# Patient Record
Sex: Female | Born: 1950
Health system: Southern US, Community
[De-identification: ages and names within clinical notes are randomized; demographics above are authoritative.]

## PROBLEM LIST (undated history)

## (undated) DIAGNOSIS — E11319 Type 2 diabetes mellitus with unspecified diabetic retinopathy without macular edema: Secondary | ICD-10-CM

## (undated) DIAGNOSIS — E119 Type 2 diabetes mellitus without complications: Secondary | ICD-10-CM

## (undated) DIAGNOSIS — E113499 Type 2 diabetes mellitus with severe nonproliferative diabetic retinopathy without macular edema, unspecified eye: Secondary | ICD-10-CM

## (undated) DIAGNOSIS — Z780 Asymptomatic menopausal state: Secondary | ICD-10-CM

## (undated) DIAGNOSIS — N189 Chronic kidney disease, unspecified: Secondary | ICD-10-CM

## (undated) DIAGNOSIS — M8580 Other specified disorders of bone density and structure, unspecified site: Secondary | ICD-10-CM

## (undated) DIAGNOSIS — L309 Dermatitis, unspecified: Secondary | ICD-10-CM

## (undated) DIAGNOSIS — F32A Depression, unspecified: Secondary | ICD-10-CM

## (undated) DIAGNOSIS — L409 Psoriasis, unspecified: Secondary | ICD-10-CM

## (undated) DIAGNOSIS — H548 Legal blindness, as defined in USA: Secondary | ICD-10-CM

## (undated) DIAGNOSIS — E1121 Type 2 diabetes mellitus with diabetic nephropathy: Secondary | ICD-10-CM

## (undated) DIAGNOSIS — C44310 Basal cell carcinoma of skin of unspecified parts of face: Secondary | ICD-10-CM

## (undated) DIAGNOSIS — F329 Major depressive disorder, single episode, unspecified: Secondary | ICD-10-CM

## (undated) HISTORY — PX: TUBAL LIGATION: SHX77

## (undated) HISTORY — DX: Legal blindness, as defined in USA: H54.8

## (undated) HISTORY — DX: Type 2 diabetes mellitus with severe nonproliferative diabetic retinopathy without macular edema, unspecified eye: E11.3499

## (undated) HISTORY — DX: Type 2 diabetes mellitus with diabetic nephropathy: E11.21

## (undated) HISTORY — DX: Major depressive disorder, single episode, unspecified: F32.9

## (undated) HISTORY — DX: Psoriasis, unspecified: L40.9

## (undated) HISTORY — DX: Chronic kidney disease, unspecified: N18.9

## (undated) HISTORY — PX: CATARACT EXTRACTION: SUR2

## (undated) HISTORY — DX: Type 2 diabetes mellitus with unspecified diabetic retinopathy without macular edema: E11.319

## (undated) HISTORY — DX: Dermatitis, unspecified: L30.9

## (undated) HISTORY — DX: Depression, unspecified: F32.A

## (undated) HISTORY — DX: Type 2 diabetes mellitus without complications: E11.9

## (undated) HISTORY — DX: Other specified disorders of bone density and structure, unspecified site: M85.80

## (undated) HISTORY — DX: Basal cell carcinoma of skin of unspecified parts of face: C44.310

## (undated) HISTORY — DX: Asymptomatic menopausal state: Z78.0

## (undated) HISTORY — PX: EYE SURGERY: SHX253

---

## 2012-11-07 LAB — HM PAP SMEAR

## 2013-08-20 HISTORY — PX: COLONOSCOPY: SHX174

## 2014-08-09 LAB — HM COLONOSCOPY

## 2017-02-27 ENCOUNTER — Ambulatory Visit (INDEPENDENT_AMBULATORY_CARE_PROVIDER_SITE_OTHER): Payer: Medicare Other | Admitting: Physician Assistant

## 2017-02-27 ENCOUNTER — Encounter: Payer: Self-pay | Admitting: Physician Assistant

## 2017-02-27 VITALS — BP 100/61 | HR 93 | Ht 61.5 in | Wt 154.0 lb

## 2017-02-27 DIAGNOSIS — Z79899 Other long term (current) drug therapy: Secondary | ICD-10-CM

## 2017-02-27 DIAGNOSIS — Z13820 Encounter for screening for osteoporosis: Secondary | ICD-10-CM | POA: Diagnosis not present

## 2017-02-27 DIAGNOSIS — L309 Dermatitis, unspecified: Secondary | ICD-10-CM

## 2017-02-27 DIAGNOSIS — L409 Psoriasis, unspecified: Secondary | ICD-10-CM

## 2017-02-27 DIAGNOSIS — E113493 Type 2 diabetes mellitus with severe nonproliferative diabetic retinopathy without macular edema, bilateral: Secondary | ICD-10-CM | POA: Diagnosis not present

## 2017-02-27 DIAGNOSIS — Z780 Asymptomatic menopausal state: Secondary | ICD-10-CM

## 2017-02-27 DIAGNOSIS — F331 Major depressive disorder, recurrent, moderate: Secondary | ICD-10-CM

## 2017-02-27 DIAGNOSIS — H548 Legal blindness, as defined in USA: Secondary | ICD-10-CM

## 2017-02-27 DIAGNOSIS — L03011 Cellulitis of right finger: Secondary | ICD-10-CM

## 2017-02-27 DIAGNOSIS — Z5181 Encounter for therapeutic drug level monitoring: Secondary | ICD-10-CM

## 2017-02-27 DIAGNOSIS — F988 Other specified behavioral and emotional disorders with onset usually occurring in childhood and adolescence: Secondary | ICD-10-CM

## 2017-02-27 DIAGNOSIS — Z7689 Persons encountering health services in other specified circumstances: Secondary | ICD-10-CM

## 2017-02-27 DIAGNOSIS — Z1231 Encounter for screening mammogram for malignant neoplasm of breast: Secondary | ICD-10-CM

## 2017-02-27 DIAGNOSIS — E559 Vitamin D deficiency, unspecified: Secondary | ICD-10-CM | POA: Diagnosis not present

## 2017-02-27 DIAGNOSIS — M7751 Other enthesopathy of right foot: Secondary | ICD-10-CM | POA: Diagnosis not present

## 2017-02-27 HISTORY — DX: Legal blindness, as defined in USA: H54.8

## 2017-02-27 LAB — POCT GLYCOSYLATED HEMOGLOBIN (HGB A1C): HEMOGLOBIN A1C: 6.9

## 2017-02-27 MED ORDER — SERTRALINE HCL 50 MG PO TABS: 50.0000 mg | ORAL_TABLET | Freq: Every day | ORAL | 1 refills | Status: DC

## 2017-02-27 MED ORDER — CALCIUM CARBONATE-VITAMIN D 600-400 MG-UNIT PO TABS: 1.0000 | ORAL_TABLET | Freq: Two times a day (BID) | ORAL | 11 refills | Status: AC

## 2017-02-27 MED ORDER — PIMECROLIMUS 1 % EX CREA: TOPICAL_CREAM | Freq: Two times a day (BID) | CUTANEOUS | 2 refills | Status: AC | PRN

## 2017-02-27 MED ORDER — DAPAGLIFLOZIN PROPANEDIOL 10 MG PO TABS: 10.0000 mg | ORAL_TABLET | Freq: Every day | ORAL | 0 refills | Status: DC

## 2017-02-27 MED ORDER — AMBULATORY NON FORMULARY MEDICATION: 0 refills | Status: DC

## 2017-02-27 MED ORDER — GLIMEPIRIDE 4 MG PO TABS: 4.0000 mg | ORAL_TABLET | Freq: Every day | ORAL | 1 refills | Status: DC

## 2017-02-27 MED ORDER — ATORVASTATIN CALCIUM 10 MG PO TABS: 5.0000 mg | ORAL_TABLET | Freq: Every day | ORAL | 1 refills | Status: DC

## 2017-02-27 MED ORDER — CLOBETASOL PROPIONATE 0.05 % EX CREA: 1.0000 "application " | TOPICAL_CREAM | Freq: Two times a day (BID) | CUTANEOUS | 2 refills | Status: DC | PRN

## 2017-02-27 NOTE — Progress Notes (Signed)
HPI:                                                                Nichole Fitzgerald is a 66 y.o. female who presents to [facility] today to establish care  Patient recently relocated from [place]  DMII: taking Metformin 1000 bid, Faxiga 10mg , and Glimeperide daily. Compliant with medications. Last A1C 6.9 (1 year ago, patient reported). Checks blood sugars at home. Blood sugar range 120's (6 months ago). Reports she is legally blind from retinopathy. Denies polyuria and paresthesias. Denies hypoglycemic events. Denies ulcers/wounds on feet. Eye exam: May 2018, Dr. Mickie Hillier [facility] [place]), severe retinopathy  Foot exam: due Diet: "poor" Exercise: not currently  Depression: has been out Zoloft for a few months. Endorses depressed mood. Reports increased stress related to moving. She also is depressed about her blindness and adjusting to retirement.  Endorses poor sleep. No history of self-harm. No history of hospitalizations. Denies symptoms of mania/hypomania. Endorses one episode of suicidal thinking. Denies auditory/visual hallucinations.  Ankle pain: patient reports pain and swelling of her right heel/posterior ankle for several months. Denies known injury or trauma. She has noted pain is relieved a bit when she wears heels. Pain is exacerbated by stair climbing.   Health Maintenance Health Maintenance  Topic Date Due  . HEMOGLOBIN A1C  Aug 24, 1950  . Hepatitis C Screening  May [birth year], 1951  . FOOT EXAM  02/01/1962  . OPHTHALMOLOGY EXAM  02/01/1962  . URINE MICROALBUMIN  02/01/1962  . HIV Screening  02/02/1967  . TETANUS/TDAP  02/02/1971  . PAP SMEAR  02/01/1973  . MAMMOGRAM  02/01/2002  . COLONOSCOPY  02/01/2002  . DEXA SCAN  02/01/2017  . PNA vac Low Risk Adult (1 of 2 - PCV13) 02/01/2017  . INFLUENZA VACCINE  03/20/2017    Past Medical History:  Diagnosis Date  . Basal cell carcinoma (BCC) of face   . Depression   .  Diabetes mellitus without complication ([place])   . Diabetic retinopathy [facility])    Past Surgical History:  Procedure Laterality Date  . CATARACT EXTRACTION    . COLONOSCOPY  2015  . EYE SURGERY    . TUBAL LIGATION     Social History  Substance Use Topics  . Smoking status: Never Smoker  . Smokeless tobacco: Never Used  . Alcohol use Yes     Comment: <1 drink/month   family history includes Alcohol abuse in her father; Breast cancer in her mother; Cancer in her mother; Diabetes in her father; Heart attack in her paternal grandfather; Hypertension in her mother; Skin cancer in her mother; Stroke in her father.  ROS: Review of Systems  Constitutional: Negative.   HENT: Negative.   Eyes: Positive for blurred vision (legally blind). Negative for pain.  Respiratory: Negative.   Cardiovascular: Negative.   Gastrointestinal: Negative.   Genitourinary: Negative.   Musculoskeletal: Positive for joint pain (right heel). Negative for falls.  Skin: Negative.   Neurological: Negative.   Psychiatric/Behavioral: Positive for depression. Negative for hallucinations, memory loss, substance abuse and suicidal ideas.     Medications: Current Outpatient Prescriptions  Medication Sig Dispense Refill  . Multiple Vitamins-Minerals (MULTIVITAMIN ADULT) CHEW Chew by mouth.    . AMBULATORY NON FORMULARY MEDICATION Single  glucometer with lancets, test strips.  Check morning fasting blood sugar daily 1 each 0  . atorvastatin (LIPITOR) 10 MG tablet Take 0.5 tablets (5 mg total) by mouth daily. 45 tablet 1  . Calcium Carbonate-Vitamin D 600-400 MG-UNIT tablet Take 1 tablet by mouth 2 (two) times daily. 60 tablet 11  . clobetasol cream (TEMOVATE) 4.31 % Apply 1 application topically 2 (two) times daily as needed. 60 g 2  . dapagliflozin propanediol (FARXIGA) 10 MG TABS tablet Take 10 mg by mouth daily. 90 tablet 0  . glimepiride (AMARYL) 4 MG tablet Take 1 tablet (4 mg total) by mouth daily with breakfast.  90 tablet 1  . pimecrolimus (ELIDEL) 1 % cream Apply topically 2 (two) times daily as needed. 30 g 2  . sertraline (ZOLOFT) 50 MG tablet Take 1 tablet (50 mg total) by mouth daily. 90 tablet 1   No current facility-administered medications for this visit.    Allergies  Allergen Reactions  . Bydureon [Exenatide] Swelling    Lip swelling       Objective:  BP 100/61   Pulse 93   Ht 5' 1.5" (1.562 m)   Wt 154 lb (69.9 kg)   BMI 28.63 kg/m  Gen: well-groomed, cooperative, not ill-appearing, no distress HEENT: normal conjunctiva, TM's clear, oropharynx clear, moist mucus membranes, no thyromegaly or tenderness Pulm: Normal work of breathing, normal phonation, clear to auscultation bilaterally CV: Normal rate, regular rhythm, s1 and s2 distinct, no murmurs, clicks or rubs, no carotid bruit GI: abdomen soft, nondistended, nontender, no masses Neuro: alert and oriented x 3, EOM's intact, PERRLA, normal tone, no tremor MSK: Right posterior ankle with swelling and tenderness, full active ROM, DP and PT pulses intact; moving all extremities, normal gait and station, no peripheral edema Skin: warm, dry, intact; scaly patch on anterior neck and face Psych: good eye contact,  "depressed" mood, affect mood-congruent, normal speech and thought content  Depression screen [facility] 2/9 02/27/2017  Decreased Interest 2  Down, Depressed, Hopeless 2  PHQ - 2 Score 4  Altered sleeping 3  Tired, decreased energy 3  Change in appetite 2  Feeling bad or failure about yourself  1  Trouble concentrating 1  Moving slowly or fidgety/restless 0  Suicidal thoughts 1  PHQ-9 Score 15     Results for orders placed or performed in visit on 02/27/17 (from the past 72 hour(s))  POCT HgB A1C     Status: Abnormal   Collection Time: 02/27/17  2:12 PM  Result Value Ref Range   Hemoglobin A1C 6.9    No results found.    Assessment and Plan: 66 y.o. female with    1. Encounter to establish care - reviewed  PMH - reviewed health maintenance - Colonoscopy UTD per patient, [facility] [place] - requesting outside records - due for Mammogram and Dexa  2. Type 2 diabetes mellitus with severe nonproliferative retinopathy of both eyes, without long-term current use of insulin, macular edema presence unspecified (HCC) - POCT HgB A1C 6.9 - AMBULATORY NON FORMULARY MEDICATION; Single glucometer with lancets, test strips.  Check morning fasting blood sugar daily  Dispense: 1 each; Refill: 0 - atorvastatin (LIPITOR) 10 MG tablet; Take 0.5 tablets (5 mg total) by mouth daily.  Dispense: 45 tablet; Refill: 1 - dapagliflozin propanediol (FARXIGA) 10 MG TABS tablet; Take 10 mg by mouth daily.  Dispense: 90 tablet; Refill: 0 - glimepiride (AMARYL) 4 MG tablet; Take 1 tablet (4 mg total) by mouth daily with  breakfast.  Dispense: 90 tablet; Refill: 1 - CBC - COMPLETE METABOLIC PANEL WITH GFR   3. Legally blind - 2/2 severe diabetic retinopathy - patient states she is established with a retina specialist in the area - she does not drive  4. Retrocalcaneal bursitis (back of heel), right - heel cups - rehab exercises daily - follow-up with Sports Medicine in 1 week  5. Scalp psoriasis - clobetasol cream (TEMOVATE) 0.05 %; Apply 1 application topically 2 (two) times daily as needed.  Dispense: 60 g; Refill: 2  6. Eczema, unspecified type - pimecrolimus (ELIDEL) 1 % cream; Apply topically 2 (two) times daily as needed.  Dispense: 30 g; Refill: 2  7. Vitamin D deficiency - VITAMIN D 25 Hydroxy (Vit-D Deficiency, Fractures)  8. Encounter for monitoring statin therapy - Lipid Panel w/reflex Direct LDL  9. Breast cancer screening by mammogram - MM DIGITAL SCREENING BILATERAL; Future  10. Screening for osteoporosis - DG Bone Density; Future  11. Postmenopausal - DG Bone Density; Future - Calcium Carbonate-Vitamin D 600-400 MG-UNIT tablet; Take 1 tablet by mouth 2 (two) times daily.  Dispense: 60 tablet;  Refill: 11  12. Moderate episode of recurrent major depressive disorder (HCC) - PHQ9 score 15, moderate - restarting Zoloft. Close follow-up in 4 weeks - sertraline (ZOLOFT) 50 MG tablet; Take 1 tablet (50 mg total) by mouth daily.  Dispense: 90 tablet; Refill: 1  Patient education and anticipatory guidance given Patient agrees with treatment plan Follow-up in 4 weeks or sooner as needed  Darlyne Russian PA-C

## 2017-02-27 NOTE — Patient Instructions (Addendum)
Schedule an appointment with Sports Medicine for heel pain - Avoid flat shoes and barefoot walking - Purchase some OTC heel cups for added support - Rehab exercises  I have ordered fasting labs. The lab is a walk-in open M-F 7:30a-4:30p (closed 12:30-1:30p). Nothing to eat or drink after midnight or at least 8 hours before your blood draw. You can have water and your medications.   For mood: - Restart Zoloft 50mg  daily - Try to get at least 3 days of cardiovascular exercise per week - Follow-up in 4 weeks If you experience any worsening suicidal thoughts, go to the nearest emergency room or call [facility] 1-800-SUICIDE [place] [phone]   Living With Depression Everyone experiences occasional disappointment, sadness, and loss in their lives. When you are feeling down, blue, or sad for at least 2 weeks in a row, it may mean that you have depression. Depression can affect your thoughts and feelings, relationships, daily activities, and physical health. It is caused by changes in the way your brain functions. If you receive a diagnosis of depression, your health care provider will tell you which type of depression you have and what treatment options are available to you. If you are living with depression, there are ways to help you recover from it and also ways to prevent it from coming back. How to cope with lifestyle changes Coping with stress Stress is your body's reaction to life changes and events, both good and bad. Stressful situations may include:  Getting married.  The death of a spouse.  Losing a job.  Retiring.  Having a baby.  Stress can last just a few hours or it can be ongoing. Stress can play a major role in depression, so it is important to learn both how to cope with stress and how to think about it differently. Talk with your health care provider or a counselor if you would like to learn more about stress reduction. He or she may  suggest some stress reduction techniques, such as:  Music therapy. This can include creating music or listening to music. Choose music that you enjoy and that inspires you.  Mindfulness-based meditation. This kind of meditation can be done while sitting or walking. It involves being aware of your normal breaths, rather than trying to control your breathing.  Centering prayer. This is a kind of meditation that involves focusing on a spiritual word or phrase. Choose a word, phrase, or sacred image that is meaningful to you and that brings you peace.  Deep breathing. To do this, expand your stomach and inhale slowly through your nose. Hold your breath for 3-5 seconds, then exhale slowly, allowing your stomach muscles to relax.  Muscle relaxation. This involves intentionally tensing muscles then relaxing them.  Choose a stress reduction technique that fits your lifestyle and personality. Stress reduction techniques take time and practice to develop. Set aside 5-15 minutes a day to do them. Therapists can offer training in these techniques. The training may be covered by some insurance plans. Other things you can do to manage stress include:  Keeping a stress diary. This can help you learn what triggers your stress and ways to control your response.  Understanding what your limits are and saying no to requests or events that lead to a schedule that is too full.  Thinking about how you respond to certain situations. You may not be able to control everything, but you can control how you react.  Adding humor to  your life by watching funny films or TV shows.  Making time for activities that help you relax and not feeling guilty about spending your time this way.  Medicines Your health care provider may suggest certain medicines if he or she feels that they will help improve your condition. Avoid using alcohol and other substances that may prevent your medicines from working properly (may interact).  It is also important to:  Talk with your pharmacist or health care provider about all the medicines that you take, their possible side effects, and what medicines are safe to take together.  Make it your goal to take part in all treatment decisions (shared decision-making). This includes giving input on the side effects of medicines. It is best if shared decision-making with your health care provider is part of your total treatment plan.  If your health care provider prescribes a medicine, you may not notice the full benefits of it for 4-8 weeks. Most people who are treated for depression need to be on medicine for at least 6-12 months after they feel better. If you are taking medicines as part of your treatment, do not stop taking medicines without first talking to your health care provider. You may need to have the medicine slowly decreased (tapered) over time to decrease the risk of harmful side effects. Relationships Your health care provider may suggest family therapy along with individual therapy and drug therapy. While there may not be family problems that are causing you to feel depressed, it is still important to make sure your family learns as much as they can about your mental health. Having your family's support can help make your treatment successful. How to recognize changes in your condition Everyone has a different response to treatment for depression. Recovery from major depression happens when you have not had signs of major depression for two months. This may mean that you will start to:  Have more interest in doing activities.  Feel less hopeless than you did 2 months ago.  Have more energy.  Overeat less often, or have better or improving appetite.  Have better concentration.  Your health care provider will work with you to decide the next steps in your recovery. It is also important to recognize when your condition is getting worse. Watch for these signs:  Having fatigue  or low energy.  Eating too much or too little.  Sleeping too much or too little.  Feeling restless, agitated, or hopeless.  Having trouble concentrating or making decisions.  Having unexplained physical complaints.  Feeling irritable, angry, or aggressive.  Get help as soon as you or your family members notice these symptoms coming back. How to get support and help from others How to talk with friends and family members about your condition Talking to friends and family members about your condition can provide you with one way to get support and guidance. Reach out to trusted friends or family members, explain your symptoms to them, and let them know that you are working with a health care provider to treat your depression. Financial resources Not all insurance plans cover mental health care, so it is important to check with your insurance carrier. If paying for co-pays or counseling services is a problem, search for a local or county mental health care center. They may be able to offer public mental health care services at low or no cost when you are not able to see a private health care provider. If you are taking medicine for depression,  you may be able to get the generic form, which may be less expensive. Some makers of prescription medicines also offer help to patients who cannot afford the medicines they need. Follow these instructions at home:  Get the right amount and quality of sleep.  Cut down on using caffeine, tobacco, alcohol, and other potentially harmful substances.  Try to exercise, such as walking or lifting small weights.  Take over-the-counter and prescription medicines only as told by your health care provider.  Eat a healthy diet that includes plenty of vegetables, fruits, whole grains, low-fat dairy products, and lean protein. Do not eat a lot of foods that are high in solid fats, added sugars, or salt.  Keep all follow-up visits as told by your health care  provider. This is important. Contact a health care provider if:  You stop taking your antidepressant medicines, and you have any of these symptoms: ? Nausea. ? Headache. ? Feeling lightheaded. ? Chills and body aches. ? Not being able to sleep (insomnia).  You or your friends and family think your depression is getting worse. Get help right away if:  You have thoughts of hurting yourself or others. If you ever feel like you may hurt yourself or others, or have thoughts about taking your own life, get help right away. You can go to your nearest emergency department or call:  Your local emergency services (911 in the [place]  A suicide crisis helpline, such as the [facility] at 913-148-3676. This is open 24-hours a day.  Summary  If you are living with depression, there are ways to help you recover from it and also ways to prevent it from coming back.  Work with your health care team to create a management plan that includes counseling, stress management techniques, and healthy lifestyle habits. This information is not intended to replace advice given to you by your health care provider. Make sure you discuss any questions you have with your health care provider. Document Released: 07/09/2016 Document Revised: 07/09/2016 Document Reviewed: 07/09/2016 Elsevier Interactive Patient Education  [employer].

## 2017-02-28 DIAGNOSIS — Z780 Asymptomatic menopausal state: Secondary | ICD-10-CM | POA: Insufficient documentation

## 2017-02-28 DIAGNOSIS — L409 Psoriasis, unspecified: Secondary | ICD-10-CM | POA: Insufficient documentation

## 2017-02-28 DIAGNOSIS — F988 Other specified behavioral and emotional disorders with onset usually occurring in childhood and adolescence: Secondary | ICD-10-CM | POA: Insufficient documentation

## 2017-02-28 DIAGNOSIS — L309 Dermatitis, unspecified: Secondary | ICD-10-CM | POA: Insufficient documentation

## 2017-02-28 DIAGNOSIS — M7751 Other enthesopathy of right foot: Secondary | ICD-10-CM | POA: Insufficient documentation

## 2017-02-28 DIAGNOSIS — F331 Major depressive disorder, recurrent, moderate: Secondary | ICD-10-CM | POA: Insufficient documentation

## 2017-02-28 HISTORY — DX: Psoriasis, unspecified: L40.9

## 2017-02-28 HISTORY — DX: Dermatitis, unspecified: L30.9

## 2017-02-28 HISTORY — DX: Asymptomatic menopausal state: Z78.0

## 2017-02-28 MED ORDER — MUPIROCIN 2 % EX OINT: 1.0000 "application " | TOPICAL_OINTMENT | Freq: Three times a day (TID) | CUTANEOUS | 0 refills | Status: AC

## 2017-02-28 MED ORDER — CHLORHEXIDINE GLUCONATE 4 % EX LIQD: CUTANEOUS | 0 refills | Status: DC

## 2017-03-04 ENCOUNTER — Ambulatory Visit (INDEPENDENT_AMBULATORY_CARE_PROVIDER_SITE_OTHER): Payer: Medicare Other | Admitting: Family Medicine

## 2017-03-04 ENCOUNTER — Encounter: Payer: Self-pay | Admitting: Family Medicine

## 2017-03-04 VITALS — BP 102/64 | HR 102 | Ht 61.5 in | Wt 150.0 lb

## 2017-03-04 DIAGNOSIS — M9261 Juvenile osteochondrosis of tarsus, right ankle: Secondary | ICD-10-CM | POA: Diagnosis not present

## 2017-03-04 DIAGNOSIS — M7661 Achilles tendinitis, right leg: Secondary | ICD-10-CM | POA: Diagnosis not present

## 2017-03-04 MED ORDER — NITROGLYCERIN 0.2 MG/HR TD PT24: MEDICATED_PATCH | TRANSDERMAL | 1 refills | Status: AC

## 2017-03-04 MED ORDER — METFORMIN HCL 1000 MG PO TABS: 1000.0000 mg | ORAL_TABLET | Freq: Two times a day (BID) | ORAL | 1 refills | Status: DC

## 2017-03-04 NOTE — Progress Notes (Signed)
   Subjective:    I'm seeing this patient as a consultation for:  Nichole Dredge, PA-C   CC: Right posterior heel pain.  HPI: Patient notes a several month history of pain at the right posterior heel. This is associated with swelling. She denies any specific injury. She notes the pain is worse when she climbs stairs or goes up a hill and with pressure at the posterior heel. She has tried over-the-counter medicines for pain which have helped a little. Additionally she has tried ice and relative rest which has helped some. She denies any radiating pain weakness or numbness fevers or chills.  Past medical history, Surgical history, Family history not pertinant except as noted below, Social history, Allergies, and medications have been entered into the medical record, reviewed, and no changes needed.   Review of Systems: No new headache, visual changes, nausea, vomiting, diarrhea, constipation, dizziness, abdominal pain, skin rash, fevers, chills, night sweats, weight loss, swollen lymph nodes, body aches, joint swelling, muscle aches, chest pain, shortness of breath, mood changes, visual or auditory hallucinations.   Objective:    Vitals:   03/04/17 1428  BP: 102/64  Pulse: (!) 102   General: Well Developed, well nourished, and in no acute distress.  Neuro/Psych: Alert and oriented x3, extra-ocular muscles intact, able to move all 4 extremities, sensation grossly intact. Skin: Warm and dry, no rashes noted.  Respiratory: Not using accessory muscles, speaking in full sentences, trachea midline.  Cardiovascular: Pulses palpable, no extremity edema. Abdomen: Does not appear distended. MSK: Right posterior ankle shows swelling at the insertion of the Achilles tendon onto the calcaneus. This is mildly tender to touch. She has intact motion of the foot and ankle with normal pulses and capillary refill.  Limited musculoskeletal ultrasound of the right Achilles tendon shows an intact  normal size Achilles tendon with no significant retrocalcaneal bursitis. As the Achilles tendon attaches onto the calcaneus there is a hyperechoic change within the tendon consistent with Haglund's deformity. There is increased Doppler activity as well indicative of tendinitis.  No results found for this or any previous visit (from the past 24 hour(s)). No results found.  Impression and Recommendations:    Assessment and Plan: 66 y.o. female with Achilles tendinitis and Haglund's deformity. Plan to work on eccentric heel exercises and nitroglycerin patch protocol. We'll recheck in 6 weeks or sooner if needed..   No orders of the defined types were placed in this encounter.  Meds ordered this encounter  Medications  . DISCONTD: metFORMIN (GLUCOPHAGE) 1000 MG tablet    Sig: Take 1,000 mg by mouth 2 (two) times daily with a meal.  . clobetasol (TEMOVATE) 0.05 % external solution    Sig: Apply 1 application topically 2 (two) times daily as needed.  . metFORMIN (GLUCOPHAGE) 1000 MG tablet    Sig: Take 1 tablet (1,000 mg total) by mouth 2 (two) times daily with a meal.    Dispense:  180 tablet    Refill:  1  . nitroGLYCERIN (NITRODUR - DOSED IN MG/24 HR) 0.2 mg/hr patch    Sig: 1/4 patch to heel for tendonitis    Dispense:  30 patch    Refill:  1    Discussed warning signs or symptoms. Please see discharge instructions. Patient expresses understanding.

## 2017-03-04 NOTE — Patient Instructions (Signed)
Thank you for coming in today. Do the slow heel exercises 30 reps 2-3x daily.  Apply the nitro patches.  Continue ICE.   Recheck in 4-6 weeks.  Return sooner if needed.   Nitroglycerin Protocol   Apply 1/4 nitroglycerin patch to affected area daily.  Change position of patch within the affected area every 24 hours.  You may experience a headache during the first 1-2 weeks of using the patch, these should subside.  If you experience headaches after beginning nitroglycerin patch treatment, you may take your preferred over the counter pain reliever.  Another side effect of the nitroglycerin patch is skin irritation or rash related to patch adhesive.  Please notify our office if you develop more severe headaches or rash, and stop the patch.  Tendon healing with nitroglycerin patch may require 12 to 24 weeks depending on the extent of injury.  Men should not use if taking Viagra, Cialis, or Levitra.   Do not use if you have migraines or rosacea.     Achilles Tendinitis Achilles tendinitis is inflammation of the tough, cord-like band that attaches the lower leg muscles to the heel bone (Achilles tendon). This is usually caused by overusing the tendon and the ankle joint. Achilles tendinitis usually gets better over time with treatment and caring for yourself at home. It can take weeks or months to heal completely. What are the causes? This condition may be caused by:  A sudden increase in exercise or activity, such as running.  Doing the same exercises or activities (such as jumping) over and over.  Not warming up calf muscles before exercising.  Exercising in shoes that are worn out or not made for exercise.  Having arthritis or a bone growth (spur) on the back of the heel bone. This can rub against the tendon and hurt it.  Age-related wear and tear. Tendons become less flexible with age and more likely to be injured.  What are the signs or symptoms? Common symptoms of  this condition include:  Pain in the Achilles tendon or in the back of the leg, just above the heel. The pain usually gets worse with exercise.  Stiffness or soreness in the back of the leg, especially in the morning.  Swelling of the skin over the Achilles tendon.  Thickening of the tendon.  Bone spurs at the bottom of the Achilles tendon, near the heel.  Trouble standing on tiptoe.  How is this diagnosed? This condition is diagnosed based on your symptoms and a physical exam. You may have tests, including:  X-rays.  MRI.  How is this treated? The goal of treatment is to relieve symptoms and help your injury heal. Treatment may include:  Decreasing or stopping activities that caused the tendinitis. This may mean switching to low-impact exercises like biking or swimming.  Icing the injured area.  Doing physical therapy, including strengthening and stretching exercises.  NSAIDs to help relieve pain and swelling.  Using supportive shoes, wraps, heel lifts, or a walking boot (air cast).  Surgery. This may be done if your symptoms do not improve after 6 months.  Using high-energy shock wave impulses to stimulate the healing process (extracorporeal shock wave therapy). This is rare.  Injection of medicines to help relieve inflammation (corticosteroids). This is rare.  Follow these instructions at home: If you have an air cast:  Wear the cast as told by your health care provider. Remove it only as told by your health care provider.  Loosen the cast if  your toes tingle, become numb, or turn cold and blue. Activity  Gradually return to your normal activities once your health care provider approves. Do not do activities that cause pain. ? Consider doing low-impact exercises, like cycling or swimming.  If you have an air cast, ask your health care provider when it is safe for you to drive.  If physical therapy was prescribed, do exercises as told by your health care  provider or physical therapist. Managing pain, stiffness, and swelling  Raise (elevate) your foot above the level of your heart while you are sitting or lying down.  Move your toes often to avoid stiffness and to lessen swelling.  If directed, put ice on the injured area: ? Put ice in a plastic bag. ? Place a towel between your skin and the bag. ? Leave the ice on for 20 minutes, 2-3 times a day General instructions  If directed, wrap your foot with an elastic bandage or other wrap. This can help keep your tendon from moving too much while it heals. Your health care provider will show you how to wrap your foot correctly.  Wear supportive shoes or heel lifts only as told by your health care provider.  Take over-the-counter and prescription medicines only as told by your health care provider.  Keep all follow-up visits as told by your health care provider. This is important. Contact a health care provider if:  You have symptoms that gets worse.  You have pain that does not get better with medicine.  You develop new, unexplained symptoms.  You develop warmth and swelling in your foot.  You have a fever. Get help right away if:  You have a sudden popping sound or sensation in your Achilles tendon followed by severe pain.  You cannot move your toes or foot.  You cannot put any weight on your foot. Summary  Achilles tendinitis is inflammation of the tough, cord-like band that attaches the lower leg muscles to the heel bone (Achilles tendon).  This condition is usually caused by overusing the tendon and the ankle joint. It can also be caused by arthritis or normal aging.  The most common symptoms of this condition include pain, swelling, or stiffness in the Achilles tendon or in the back of the leg.  This condition is usually treated with rest, NSAIDs, and physical therapy. This information is not intended to replace advice given to you by your health care provider. Make sure  you discuss any questions you have with your health care provider. Document Released: 05/16/2005 Document Revised: 06/25/2016 Document Reviewed: 06/25/2016 Elsevier Interactive Patient Education  2017 [employer].

## 2017-03-12 ENCOUNTER — Ambulatory Visit (INDEPENDENT_AMBULATORY_CARE_PROVIDER_SITE_OTHER): Payer: Medicare Other

## 2017-03-12 DIAGNOSIS — M85852 Other specified disorders of bone density and structure, left thigh: Secondary | ICD-10-CM | POA: Diagnosis not present

## 2017-03-12 DIAGNOSIS — Z780 Asymptomatic menopausal state: Secondary | ICD-10-CM | POA: Diagnosis not present

## 2017-03-12 DIAGNOSIS — Z1231 Encounter for screening mammogram for malignant neoplasm of breast: Secondary | ICD-10-CM | POA: Diagnosis not present

## 2017-03-12 DIAGNOSIS — M8588 Other specified disorders of bone density and structure, other site: Secondary | ICD-10-CM

## 2017-03-12 DIAGNOSIS — Z13820 Encounter for screening for osteoporosis: Secondary | ICD-10-CM

## 2017-03-12 DIAGNOSIS — M8589 Other specified disorders of bone density and structure, multiple sites: Secondary | ICD-10-CM | POA: Diagnosis not present

## 2017-03-14 ENCOUNTER — Encounter: Payer: Self-pay | Admitting: Physician Assistant

## 2017-03-14 DIAGNOSIS — M8580 Other specified disorders of bone density and structure, unspecified site: Secondary | ICD-10-CM

## 2017-03-14 HISTORY — DX: Other specified disorders of bone density and structure, unspecified site: M85.80

## 2017-03-14 NOTE — Progress Notes (Signed)
Bone density does show osteopenia, or decreased bone density Recommended treatment is twice daily Calcium and Vitamin D There is an over-the-counter combination pill. The correct dose is 600mg  of Calcium and 400mg  of vit D twice a day Also regular weight-bearing exercise

## 2017-03-22 ENCOUNTER — Encounter: Payer: Self-pay | Admitting: Physician Assistant

## 2017-03-26 DIAGNOSIS — E113493 Type 2 diabetes mellitus with severe nonproliferative diabetic retinopathy without macular edema, bilateral: Secondary | ICD-10-CM | POA: Diagnosis not present

## 2017-03-26 DIAGNOSIS — Z79899 Other long term (current) drug therapy: Secondary | ICD-10-CM | POA: Diagnosis not present

## 2017-03-26 DIAGNOSIS — E559 Vitamin D deficiency, unspecified: Secondary | ICD-10-CM | POA: Diagnosis not present

## 2017-03-26 DIAGNOSIS — Z5181 Encounter for therapeutic drug level monitoring: Secondary | ICD-10-CM | POA: Diagnosis not present

## 2017-03-26 LAB — CBC
HEMATOCRIT: 37.2 % (ref 35.0–45.0)
HEMOGLOBIN: 11.9 g/dL (ref 11.7–15.5)
MCH: 27.8 pg (ref 27.0–33.0)
MCHC: 32 g/dL (ref 32.0–36.0)
MCV: 86.9 fL (ref 80.0–100.0)
MPV: 10.4 fL (ref 7.5–12.5)
Platelets: 238 10*3/uL (ref 140–400)
RBC: 4.28 MIL/uL (ref 3.80–5.10)
RDW: 14.3 % (ref 11.0–15.0)
WBC: 5.9 10*3/uL (ref 3.8–10.8)

## 2017-03-27 LAB — COMPLETE METABOLIC PANEL WITH GFR
ALBUMIN: 4.1 g/dL (ref 3.6–5.1)
ALK PHOS: 45 U/L (ref 33–130)
ALT: 17 U/L (ref 6–29)
AST: 13 U/L (ref 10–35)
BILIRUBIN TOTAL: 0.3 mg/dL (ref 0.2–1.2)
BUN: 23 mg/dL (ref 7–25)
CO2: 25 mmol/L (ref 20–32)
CREATININE: 0.88 mg/dL (ref 0.50–0.99)
Calcium: 9 mg/dL (ref 8.6–10.4)
Chloride: 104 mmol/L (ref 98–110)
GFR, Est African American: 80 mL/min (ref >=60)
GFR, Est Non African American: 69 mL/min (ref >=60)
GLUCOSE: 110 mg/dL — AB (ref 65–99)
Potassium: 4.9 mmol/L (ref 3.5–5.3)
SODIUM: 138 mmol/L (ref 135–146)
TOTAL PROTEIN: 6.6 g/dL (ref 6.1–8.1)

## 2017-03-27 LAB — LIPID PANEL W/REFLEX DIRECT LDL
Cholesterol: 117 mg/dL (ref <200)
HDL: 54 mg/dL (ref >50)
LDL-CHOLESTEROL: 42 mg/dL
Non-HDL Cholesterol (Calc): 63 mg/dL (ref <130)
Total CHOL/HDL Ratio: 2.2 Ratio (ref <5.0)
Triglycerides: 128 mg/dL (ref <150)

## 2017-03-27 LAB — VITAMIN D 25 HYDROXY (VIT D DEFICIENCY, FRACTURES): VIT D 25 HYDROXY: 43 ng/mL (ref 30–100)

## 2017-03-28 ENCOUNTER — Ambulatory Visit (INDEPENDENT_AMBULATORY_CARE_PROVIDER_SITE_OTHER): Payer: Medicare Other | Admitting: Physician Assistant

## 2017-03-28 VITALS — BP 110/68 | HR 88 | Temp 98.1°F | Wt 154.0 lb

## 2017-03-28 DIAGNOSIS — E1121 Type 2 diabetes mellitus with diabetic nephropathy: Secondary | ICD-10-CM | POA: Diagnosis not present

## 2017-03-28 DIAGNOSIS — E113493 Type 2 diabetes mellitus with severe nonproliferative diabetic retinopathy without macular edema, bilateral: Secondary | ICD-10-CM | POA: Diagnosis not present

## 2017-03-28 DIAGNOSIS — F3342 Major depressive disorder, recurrent, in full remission: Secondary | ICD-10-CM

## 2017-03-28 DIAGNOSIS — L409 Psoriasis, unspecified: Secondary | ICD-10-CM

## 2017-03-28 LAB — POCT UA - MICROALBUMIN
Creatinine, POC: 100 mg/dL
MICROALBUMIN (UR) POC: 80 mg/L

## 2017-03-28 MED ORDER — LISINOPRIL 5 MG PO TABS: 5.0000 mg | ORAL_TABLET | Freq: Every day | ORAL | 3 refills | Status: DC

## 2017-03-28 MED ORDER — BETAMETHASONE DIPROPIONATE 0.05 % EX LOTN: TOPICAL_LOTION | Freq: Two times a day (BID) | CUTANEOUS | 0 refills | Status: DC

## 2017-03-28 MED ORDER — AMBULATORY NON FORMULARY MEDICATION: 0 refills | Status: DC

## 2017-03-28 NOTE — Progress Notes (Signed)
HPI:                                                                Nichole Fitzgerald is a 66 y.o. female who presents to [place]: [facility] today for depression follow-up  Depression/Anxiety: taking Zoloft 50mg  without difficulty. Reports mood is improved since re-starting medication 4 weeks ago. Denies adverse effects of med. Denies depressed mood and anhedonia. Denies symptoms of mania/hypomania. Denies suicidal thinking. Denies auditory/visual hallucinations.  Patient also reports history of scalp psoriasis. Has used Clobetasol in the past, but no longer covered by her insurance. Requesting a new prescription.   Past Medical History:  Diagnosis Date  . Basal cell carcinoma (BCC) of face   . Depression   . Diabetes mellitus without complication ([place])   . Diabetic retinopathy ([place])   . Osteopenia determined by x-ray 03/14/2017   Repeat DEXA 02/2018   Past Surgical History:  Procedure Laterality Date  . CATARACT EXTRACTION    . COLONOSCOPY  2015  . EYE SURGERY    . TUBAL LIGATION     Social History  Substance Use Topics  . Smoking status: Never Smoker  . Smokeless tobacco: Never Used  . Alcohol use Yes     Comment: <1 drink/month   family history includes Alcohol abuse in her father; Breast cancer in her mother; Cancer in her mother; Diabetes in her father; Heart attack in her paternal grandfather; Hypertension in her mother; Skin cancer in her mother; Stroke in her father.  ROS: negative except as noted in the HPI  Medications: Current Outpatient Prescriptions  Medication Sig Dispense Refill  . AMBULATORY NON FORMULARY MEDICATION Single glucometer with lancets, test strips.  Check morning fasting blood sugar daily 1 each 0  . atorvastatin (LIPITOR) 10 MG tablet Take 0.5 tablets (5 mg total) by mouth daily. 45 tablet 1  . Calcium Carbonate-Vitamin D 600-400 MG-UNIT tablet Take 1 tablet by mouth 2 (two) times daily. 60 tablet 11  .  chlorhexidine (HIBICLENS) 4 % external liquid Soak infected area in solution three times daily. 120 mL 0  . clobetasol (TEMOVATE) 0.05 % external solution Apply 1 application topically 2 (two) times daily as needed.    . dapagliflozin propanediol (FARXIGA) 10 MG TABS tablet Take 10 mg by mouth daily. 90 tablet 0  . glimepiride (AMARYL) 4 MG tablet Take 1 tablet (4 mg total) by mouth daily with breakfast. 90 tablet 1  . metFORMIN (GLUCOPHAGE) 1000 MG tablet Take 1 tablet (1,000 mg total) by mouth 2 (two) times daily with a meal. 180 tablet 1  . Multiple Vitamins-Minerals (MULTIVITAMIN ADULT) CHEW Chew by mouth.    . mupirocin ointment (BACTROBAN) 2 % Apply 1 application topically 3 (three) times daily. 22 g 0  . nitroGLYCERIN (NITRODUR - DOSED IN MG/24 HR) 0.2 mg/hr patch 1/4 patch to heel for tendonitis 30 patch 1  . pimecrolimus (ELIDEL) 1 % cream Apply topically 2 (two) times daily as needed. 30 g 2  . sertraline (ZOLOFT) 50 MG tablet Take 1 tablet (50 mg total) by mouth daily. 90 tablet 1   No current facility-administered medications for this visit.    Allergies  Allergen Reactions  . Bydureon [Exenatide] Swelling    Lip swelling  Objective:  BP 110/68 (BP Location: Left Arm, Patient Position: Sitting, Cuff Size: Normal)   Pulse 88   Temp 98.1 F (36.7 C) (Oral)   Wt 154 lb (69.9 kg)   SpO2 94%   BMI 28.63 kg/m  Gen:  not ill-appearing, no distress, appropriate for age 65: normal conjunctiva,trachea midline Pulm: Normal work of breathing, normal phonation Neuro: alert and oriented x 3, no tremor MSK: extremities atraumatic, normal gait and station Skin: warm, dry, intact; dry, scaly plaques on scalp Psych: well-groomed, cooperative, good eye contact, euthymic mood, affect mood-congruent, normal speech and thought content  Depression screen [facility] 2/9 03/28/2017 02/27/2017  Decreased Interest 0 2  Down, Depressed, Hopeless 0 2  PHQ - 2 Score 0 4  Altered sleeping 1 3   Tired, decreased energy 1 3  Change in appetite 1 2  Feeling bad or failure about yourself  0 1  Trouble concentrating 0 1  Moving slowly or fidgety/restless 0 0  Suicidal thoughts 0 1  PHQ-9 Score 3 15   GAD 7 : Generalized Anxiety Score 03/28/2017  Nervous, Anxious, on Edge 0  Control/stop worrying 1  Worry too much - different things 1  Trouble relaxing 0  Restless 0  Easily annoyed or irritable 0  Afraid - awful might happen 0  Total GAD 7 Score 2     Results for orders placed or performed in visit on 02/27/17 (from the past 72 hour(s))  CBC     Status: None   Collection Time: 03/26/17 11:18 AM  Result Value Ref Range   WBC 5.9 3.8 - 10.8 K/uL   RBC 4.28 3.80 - 5.10 MIL/uL   Hemoglobin 11.9 11.7 - 15.5 g/dL   HCT 37.2 35.0 - 45.0 %   MCV 86.9 80.0 - 100.0 fL   MCH 27.8 27.0 - 33.0 pg   MCHC 32.0 32.0 - 36.0 g/dL   RDW 14.3 11.0 - 15.0 %   Platelets 238 140 - 400 K/uL   MPV 10.4 7.5 - 12.5 fL  COMPLETE METABOLIC PANEL WITH GFR     Status: Abnormal   Collection Time: 03/26/17 11:18 AM  Result Value Ref Range   Sodium 138 135 - 146 mmol/L   Potassium 4.9 3.5 - 5.3 mmol/L   Chloride 104 98 - 110 mmol/L   CO2 25 20 - 32 mmol/L    Comment: ** Please note change in reference range(s). **      Glucose, Bld 110 (H) 65 - 99 mg/dL   BUN 23 7 - 25 mg/dL   Creat 0.88 0.50 - 0.99 mg/dL    Comment:   For patients > or = 65 years of age: The upper reference limit for Creatinine is approximately 13% higher for people identified as African-American.      Total Bilirubin 0.3 0.2 - 1.2 mg/dL   Alkaline Phosphatase 45 33 - 130 U/L   AST 13 10 - 35 U/L   ALT 17 6 - 29 U/L   Total Protein 6.6 6.1 - 8.1 g/dL   Albumin 4.1 3.6 - 5.1 g/dL   Calcium 9.0 8.6 - 10.4 mg/dL   GFR, Est African American 80 >=60 mL/min   GFR, Est Non African American 69 >=60 mL/min  Lipid Panel w/reflex Direct LDL     Status: None   Collection Time: 03/26/17 11:18 AM  Result Value Ref Range    Cholesterol 117 <200 mg/dL   Triglycerides 128 <150 mg/dL   HDL 54 >50 mg/dL  Total CHOL/HDL Ratio 2.2 <5.0 Ratio   Non-HDL Cholesterol (Calc) 63 <130 mg/dL    Comment:   For patients with diabetes plus 1 major ASCVD risk factor, treating to a non-HDL-C goal of <100 mg/dL (LDL-C of <70 mg/dL) is considered a therapeutic option.      LDL-Cholesterol 42 mg/dL    Comment: Reference range: <100   Desirable range <100 mg/dL for primary prevention; <70 mg/dL for patients with CHD or diabetic patients with > or = 2 CHD risk factors.     The Martin-Hopkins calculation is a validated novel method that provides better accuracy than the Friedwald equation in the estimation of LDL-C, particularly when TG levels are 150-400 mg/dL and LDL-C levels are lower than 70 mg/dL. Reference:  Cresenciano Genre et al.  Comparison of a Novel Method vs the Aurelio Jew for Estimating Low-Density Lipoprotein Cholesterol Levels From the Standard Lipid Profile.  JAMA. 9476;546(50): 2061-2068.   For additional information, please refer to http://education.[facility].com/faq/FAQ164 (This link is being provided for informational/educational purposes only.)   VITAMIN D 25 Hydroxy (Vit-D Deficiency, Fractures)     Status: None   Collection Time: 03/26/17 11:18 AM  Result Value Ref Range   Vit D, 25-Hydroxy 43 30 - 100 ng/mL    Comment: Vitamin D Status           25-OH Vitamin D        Deficiency                <20 ng/mL        Insufficiency         20 - 29 ng/mL        Optimal             > or = 30 ng/mL   For 25-OH Vitamin D testing on patients on D2-supplementation and patients for whom quantitation of D2 and D3 fractions is required, the [facility] 25-OH VIT D, (D2,D3), LC/MS/MS is recommended: order code [id number] (patients > 2 yrs).    No results found.    Assessment and Plan: 66 y.o. female with   1. Recurrent major depressive disorder, in full remission ([place]) - PHQ9 score 3,  significantly improved - cont Zoloft 50mg  daily - follow-up in 6 months  2. Scalp psoriasis - betamethasone dipropionate 0.05 % lotion; Apply topically 2 (two) times daily.  Dispense: 60 mL; Refill: 0  3. Type 2 diabetes mellitus with severe nonproliferative retinopathy of both eyes, without long-term current use of insulin, macular edema presence unspecified (HCC) - POCT UA - Microalbumin positive for microalbuminuria - starting low-dose ACEi - lisinopril (PRINIVIL,ZESTRIL) 5 MG tablet; Take 1 tablet (5 mg total) by mouth daily.  Dispense: 90 tablet; Refill: 3   4. Diabetic nephropathy associated with type 2 diabetes mellitus (HCC) - lisinopril (PRINIVIL,ZESTRIL) 5 MG tablet; Take 1 tablet (5 mg total) by mouth daily.  Dispense: 90 tablet; Refill: 3 - POCT UA - Microalbumin  Patient education and anticipatory guidance given Patient agrees with treatment plan Follow-up in 6 months for DM II or sooner as needed if symptoms worsen or fail to improve  Darlyne Russian PA-C

## 2017-04-02 ENCOUNTER — Other Ambulatory Visit: Payer: Self-pay

## 2017-04-02 DIAGNOSIS — E113493 Type 2 diabetes mellitus with severe nonproliferative diabetic retinopathy without macular edema, bilateral: Secondary | ICD-10-CM

## 2017-04-02 DIAGNOSIS — E1121 Type 2 diabetes mellitus with diabetic nephropathy: Secondary | ICD-10-CM

## 2017-04-02 MED ORDER — AMBULATORY NON FORMULARY MEDICATION: 0 refills | Status: DC

## 2017-04-04 ENCOUNTER — Encounter: Payer: Self-pay | Admitting: Physician Assistant

## 2017-04-04 DIAGNOSIS — Z9889 Other specified postprocedural states: Secondary | ICD-10-CM

## 2017-04-04 DIAGNOSIS — Z85828 Personal history of other malignant neoplasm of skin: Secondary | ICD-10-CM | POA: Insufficient documentation

## 2017-05-29 DIAGNOSIS — Z23 Encounter for immunization: Secondary | ICD-10-CM | POA: Diagnosis not present

## 2017-06-08 ENCOUNTER — Other Ambulatory Visit: Payer: Self-pay | Admitting: Physician Assistant

## 2017-06-08 DIAGNOSIS — E113493 Type 2 diabetes mellitus with severe nonproliferative diabetic retinopathy without macular edema, bilateral: Secondary | ICD-10-CM

## 2017-08-21 ENCOUNTER — Other Ambulatory Visit: Payer: Self-pay

## 2017-08-21 DIAGNOSIS — F331 Major depressive disorder, recurrent, moderate: Secondary | ICD-10-CM

## 2017-08-21 DIAGNOSIS — E113493 Type 2 diabetes mellitus with severe nonproliferative diabetic retinopathy without macular edema, bilateral: Secondary | ICD-10-CM

## 2017-08-21 MED ORDER — ATORVASTATIN CALCIUM 10 MG PO TABS: 5.0000 mg | ORAL_TABLET | Freq: Every day | ORAL | 1 refills | Status: DC

## 2017-08-21 MED ORDER — GLIMEPIRIDE 4 MG PO TABS: 4.0000 mg | ORAL_TABLET | Freq: Every day | ORAL | 1 refills | Status: DC

## 2017-08-21 MED ORDER — SERTRALINE HCL 50 MG PO TABS: 50.0000 mg | ORAL_TABLET | Freq: Every day | ORAL | 1 refills | Status: DC

## 2017-08-23 ENCOUNTER — Other Ambulatory Visit: Payer: Self-pay | Admitting: Physician Assistant

## 2017-09-03 ENCOUNTER — Encounter: Payer: Self-pay | Admitting: Physician Assistant

## 2017-09-03 ENCOUNTER — Ambulatory Visit (INDEPENDENT_AMBULATORY_CARE_PROVIDER_SITE_OTHER): Payer: Medicare Other | Admitting: Physician Assistant

## 2017-09-03 VITALS — BP 118/78 | HR 89 | Temp 98.1°F | Resp 18 | Wt 146.2 lb

## 2017-09-03 DIAGNOSIS — E113493 Type 2 diabetes mellitus with severe nonproliferative diabetic retinopathy without macular edema, bilateral: Secondary | ICD-10-CM

## 2017-09-03 DIAGNOSIS — F331 Major depressive disorder, recurrent, moderate: Secondary | ICD-10-CM | POA: Diagnosis not present

## 2017-09-03 DIAGNOSIS — Z23 Encounter for immunization: Secondary | ICD-10-CM | POA: Diagnosis not present

## 2017-09-03 DIAGNOSIS — E785 Hyperlipidemia, unspecified: Secondary | ICD-10-CM

## 2017-09-03 DIAGNOSIS — Z79899 Other long term (current) drug therapy: Secondary | ICD-10-CM

## 2017-09-03 DIAGNOSIS — E1121 Type 2 diabetes mellitus with diabetic nephropathy: Secondary | ICD-10-CM | POA: Diagnosis not present

## 2017-09-03 LAB — POCT GLYCOSYLATED HEMOGLOBIN (HGB A1C): HEMOGLOBIN A1C: 6.3

## 2017-09-03 MED ORDER — ATORVASTATIN CALCIUM 10 MG PO TABS: 5.0000 mg | ORAL_TABLET | Freq: Every day | ORAL | 1 refills | Status: DC

## 2017-09-03 MED ORDER — SERTRALINE HCL 50 MG PO TABS: 100.0000 mg | ORAL_TABLET | Freq: Every day | ORAL | 0 refills | Status: DC

## 2017-09-03 MED ORDER — DAPAGLIFLOZIN PRO-METFORMIN ER 10-1000 MG PO TB24: 1.0000 | ORAL_TABLET | Freq: Every day | ORAL | 1 refills | Status: DC

## 2017-09-03 MED ORDER — LOSARTAN POTASSIUM 25 MG PO TABS: 12.5000 mg | ORAL_TABLET | Freq: Every day | ORAL | 1 refills | Status: DC

## 2017-09-03 MED ORDER — GLIMEPIRIDE 4 MG PO TABS: 4.0000 mg | ORAL_TABLET | Freq: Every day | ORAL | 1 refills | Status: DC

## 2017-09-03 MED ORDER — AMBULATORY NON FORMULARY MEDICATION: 0 refills | Status: AC

## 2017-09-03 NOTE — Progress Notes (Signed)
HPI:                                                                Nichole Fitzgerald is a 67 y.o. female who presents to [place]: [facility] today for diabetes follow-up  DMII: taking Metformin, Farxiga and Glimeperide daily. Compliant with medications. Last A1C 6.9, 6 months ago. Does not check blood sugars at home. Denies polyuria, vision change, and paresthesias. Denies hypoglycemic events. Denies ulcers/wounds on feet. Eye exam: overdue, she has severe retinopathy and is legally blind Foot exam: due  Depression/Anxiety: taking Sertraline 50mg  without difficulty. Feels like she needs to increase her dose. Has noted more depressive symptoms in the last 3 months. Denies symptoms of mania/hypomania. Denies suicidal thinking. Denies auditory/visual hallucinations.  Depression screen [facility] 2/9 09/03/2017 03/28/2017 02/27/2017  Decreased Interest 2 0 2  Down, Depressed, Hopeless 1 0 2  PHQ - 2 Score 3 0 4  Altered sleeping 3 1 3   Tired, decreased energy 3 1 3   Change in appetite 1 1 2   Feeling bad or failure about yourself  0 0 1  Trouble concentrating 0 0 1  Moving slowly or fidgety/restless 0 0 0  Suicidal thoughts 0 0 1  PHQ-9 Score 10 3 15    GAD 7 : Generalized Anxiety Score 09/03/2017 03/28/2017  Nervous, Anxious, on Edge 1 0  Control/stop worrying 1 1  Worry too much - different things 2 1  Trouble relaxing 1 0  Restless 1 0  Easily annoyed or irritable 1 0  Afraid - awful might happen 0 0  Total GAD 7 Score 7 2      Past Medical History:  Diagnosis Date  . Basal cell carcinoma (BCC) of face   . Chronic kidney disease   . Depression   . Diabetes mellitus without complication ([place])   . Diabetic nephropathy ([place])   . Diabetic retinopathy ([place])   . Eczema 02/28/2017  . Legally blind 02/27/2017  . Legally blind   . Osteopenia determined by x-ray 03/14/2017   Repeat DEXA 02/2018  . Postmenopausal 02/28/2017  . Scalp psoriasis 02/28/2017  .  Severe non-proliferative diabetic retinopathy ([place])    Past Surgical History:  Procedure Laterality Date  . CATARACT EXTRACTION    . COLONOSCOPY  2015  . EYE SURGERY    . TUBAL LIGATION     Social History   Tobacco Use  . Smoking status: Never Smoker  . Smokeless tobacco: Never Used  Substance Use Topics  . Alcohol use: Yes    Comment: <1 drink/month   family history includes Alcohol abuse in her father; Breast cancer in her mother; Cancer in her mother; Diabetes in her father; Heart attack in her paternal grandfather; Hypertension in her mother; Skin cancer in her mother; Stroke in her father.   ROS: negative except as noted in the HPI  Medications: Current Outpatient Medications  Medication Sig Dispense Refill  . AMBULATORY NON FORMULARY MEDICATION Single glucometer with lancets, test strips.  Check morning fasting blood sugar daily Diagnosis code: E11.3493, E11.21 1 each 0  . atorvastatin (LIPITOR) 10 MG tablet Take 0.5 tablets (5 mg total) by mouth at bedtime. 45 tablet 1  . betamethasone dipropionate 0.05 % lotion Apply topically 2 (two)  times daily. 60 mL 0  . Calcium Carbonate-Vitamin D 600-400 MG-UNIT tablet Take 1 tablet by mouth 2 (two) times daily. 60 tablet 11  . FARXIGA 10 MG TABS tablet TAKE 1 TABLET EVERY DAY 90 tablet 1  . glimepiride (AMARYL) 4 MG tablet Take 1 tablet (4 mg total) by mouth daily with breakfast. 90 tablet 1  . Multiple Vitamins-Minerals (MULTIVITAMIN ADULT) CHEW Chew by mouth.    . mupirocin ointment (BACTROBAN) 2 % Apply 1 application topically 3 (three) times daily. 22 g 0  . nitroGLYCERIN (NITRODUR - DOSED IN MG/24 HR) 0.2 mg/hr patch 1/4 patch to heel for tendonitis 30 patch 1  . pimecrolimus (ELIDEL) 1 % cream Apply topically 2 (two) times daily as needed. 30 g 2  . sertraline (ZOLOFT) 50 MG tablet Take 2 tablets (100 mg total) by mouth at bedtime. 90 tablet 0  . Dapagliflozin-Metformin HCl ER (XIGDUO XR) 05-999 MG TB24 Take 1 tablet by  mouth daily with breakfast. 90 tablet 1  . losartan (COZAAR) 25 MG tablet Take 0.5 tablets (12.5 mg total) by mouth daily. 45 tablet 1   No current facility-administered medications for this visit.    Allergies  Allergen Reactions  . Bydureon [Exenatide] Swelling    Lip swelling  . Lisinopril Cough       Objective:  BP 118/78   Pulse 89   Temp 98.1 F (36.7 C)   Resp 18   Wt 146 lb 3.2 oz (66.3 kg)   SpO2 97%   BMI 27.18 kg/m  Gen:  alert, not ill-appearing, no distress, appropriate for age HEENT: head normocephalic without obvious abnormality, conjunctiva and cornea clear, trachea midline Pulm: Normal work of breathing, normal phonation, clear to auscultation bilaterally CV: Normal rate, regular rhythm, s1 and s2 distinct, no murmurs, clicks or rubs  Neuro: alert and oriented x 3, no tremor MSK: extremities atraumatic, normal gait and station, no peripheral edema Skin: intact, no rashes on exposed skin, no jaundice, no cyanosis Psych: well-groomed, cooperative, good eye contact, euthymic mood, affect mood-congruent, speech is articulate, and thought processes clear and goal-directed   Diabetic Foot Exam - Simple   Simple Foot Form Diabetic Foot exam was performed with the following findings:  Yes 09/03/2017  1:33 PM  Visual Inspection No deformities, no ulcerations, no other skin breakdown bilaterally:  Yes Sensation Testing Intact to touch and monofilament testing bilaterally:  Yes Pulse Check Posterior Tibialis and Dorsalis pulse intact bilaterally:  Yes Comments     No results found for this or any previous visit (from the past 72 hour(s)). No results found.    Assessment and Plan: 67 y.o. female with  1. Type 2 diabetes mellitus with severe nonproliferative retinopathy of both eyes, without long-term current use of insulin, macular edema presence unspecified (HCC) Lab Results  Component Value Date   HGBA1C 6.3 09/03/2017  - well controlled, switching to  Xigduo XR. Continue Glimeperide. Follow-up in 6 months - on statin - adding ARB for renal protection - foot exam performed and normal today - Pneumovax iven today - Ambulatory referral to Ophthalmology for surveillance of diabetic retinopathy  2. Moderate episode of recurrent major depressive disorder (HCC) - PHQ9 score 10, mildly moderate, no acute safety issues - increasing Sertraline to 100 mg QHS. Follow-up in 3 months - sertraline (ZOLOFT) 50 MG tablet; Take 2 tablets (100 mg total) by mouth at bedtime.  Dispense: 90 tablet; Refill: 0  3. Diabetic nephropathy associated with type 2 diabetes mellitus ([place])  Lab Results  Component Value Date   MICROALBUR 80 03/28/2017  - starting low-dose ARB - losartan (COZAAR) 25 MG tablet; Take 0.5 tablets (12.5 mg total) by mouth daily.  Dispense: 45 tablet; Refill: 1   4. Need for vaccination with 13-polyvalent pneumococcal conjugate vaccine - Pneumococcal conjugate vaccine 13-valent IM  5. Encounter for long-term (current) use of medications - due for fasting labs in 3 months   Patient education and anticipatory guidance given Patient agrees with treatment plan Follow-up in 3 months for PHQ9/GAD7 or sooner as needed if symptoms worsen or fail to improve  Darlyne Russian PA-C

## 2017-09-03 NOTE — Patient Instructions (Signed)
Monitor morning fasting blood sugar. Goal is 80-130. If getting into the 150's, will need to increase the Metformin XR (Xigduo)  Diabetes Mellitus and Nutrition When you have diabetes (diabetes mellitus), it is very important to have healthy eating habits because your blood sugar (glucose) levels are greatly affected by what you eat and drink. Eating healthy foods in the appropriate amounts, at about the same times every day, can help you:  Control your blood glucose.  Lower your risk of heart disease.  Improve your blood pressure.  Reach or maintain a healthy weight.  Every person with diabetes is different, and each person has different needs for a meal plan. Your health care provider may recommend that you work with a diet and nutrition specialist (dietitian) to make a meal plan that is best for you. Your meal plan may vary depending on factors such as:  The calories you need.  The medicines you take.  Your weight.  Your blood glucose, blood pressure, and cholesterol levels.  Your activity level.  Other health conditions you have, such as heart or kidney disease.  How do carbohydrates affect me? Carbohydrates affect your blood glucose level more than any other type of food. Eating carbohydrates naturally increases the amount of glucose in your blood. Carbohydrate counting is a method for keeping track of how many carbohydrates you eat. Counting carbohydrates is important to keep your blood glucose at a healthy level, especially if you use insulin or take certain oral diabetes medicines. It is important to know how many carbohydrates you can safely have in each meal. This is different for every person. Your dietitian can help you calculate how many carbohydrates you should have at each meal and for snack. Foods that contain carbohydrates include:  Bread, cereal, rice, pasta, and crackers.  Potatoes and corn.  Peas, beans, and lentils.  Milk and yogurt.  Fruit and  juice.  Desserts, such as cakes, cookies, ice cream, and candy.  How does alcohol affect me? Alcohol can cause a sudden decrease in blood glucose (hypoglycemia), especially if you use insulin or take certain oral diabetes medicines. Hypoglycemia can be a life-threatening condition. Symptoms of hypoglycemia (sleepiness, dizziness, and confusion) are similar to symptoms of having too much alcohol. If your health care provider says that alcohol is safe for you, follow these guidelines:  Limit alcohol intake to no more than 1 drink per day for nonpregnant women and 2 drinks per day for men. One drink equals 12 oz of beer, 5 oz of wine, or 1 oz of hard liquor.  Do not drink on an empty stomach.  Keep yourself hydrated with water, diet soda, or unsweetened iced tea.  Keep in mind that regular soda, juice, and other mixers may contain a lot of sugar and must be counted as carbohydrates.  What are tips for following this plan? Reading food labels  Start by checking the serving size on the label. The amount of calories, carbohydrates, fats, and other nutrients listed on the label are based on one serving of the food. Many foods contain more than one serving per package.  Check the total grams (g) of carbohydrates in one serving. You can calculate the number of servings of carbohydrates in one serving by dividing the total carbohydrates by 15. For example, if a food has 30 g of total carbohydrates, it would be equal to 2 servings of carbohydrates.  Check the number of grams (g) of saturated and trans fats in one serving. Choose foods that  have low or no amount of these fats.  Check the number of milligrams (mg) of sodium in one serving. Most people should limit total sodium intake to less than 2,300 mg per day.  Always check the nutrition information of foods labeled as "low-fat" or "nonfat". These foods may be higher in added sugar or refined carbohydrates and should be avoided.  Talk to your  dietitian to identify your daily goals for nutrients listed on the label. Shopping  Avoid buying canned, premade, or processed foods. These foods tend to be high in fat, sodium, and added sugar.  Shop around the outside edge of the grocery store. This includes fresh fruits and vegetables, bulk grains, fresh meats, and fresh dairy. Cooking  Use low-heat cooking methods, such as baking, instead of high-heat cooking methods like deep frying.  Cook using healthy oils, such as olive, canola, or sunflower oil.  Avoid cooking with butter, cream, or high-fat meats. Meal planning  Eat meals and snacks regularly, preferably at the same times every day. Avoid going long periods of time without eating.  Eat foods high in fiber, such as fresh fruits, vegetables, beans, and whole grains. Talk to your dietitian about how many servings of carbohydrates you can eat at each meal.  Eat 4-6 ounces of lean protein each day, such as lean meat, chicken, fish, eggs, or tofu. 1 ounce is equal to 1 ounce of meat, chicken, or fish, 1 egg, or 1/4 cup of tofu.  Eat some foods each day that contain healthy fats, such as avocado, nuts, seeds, and fish. Lifestyle   Check your blood glucose regularly.  Exercise at least 30 minutes 5 or more days each week, or as told by your health care provider.  Take medicines as told by your health care provider.  Do not use any products that contain nicotine or tobacco, such as cigarettes and e-cigarettes. If you need help quitting, ask your health care provider.  Work with a Social worker or diabetes educator to identify strategies to manage stress and any emotional and social challenges. What are some questions to ask my health care provider?  Do I need to meet with a diabetes educator?  Do I need to meet with a dietitian?  What number can I call if I have questions?  When are the best times to check my blood glucose? Where to find more information:  American Diabetes  Association: diabetes.org/food-and-fitness/food  Academy of Nutrition and Dietetics: PokerClues.dk  [employer] of Diabetes and Digestive and Kidney Diseases (NIH): ContactWire.be Summary  A healthy meal plan will help you control your blood glucose and maintain a healthy lifestyle.  Working with a diet and nutrition specialist (dietitian) can help you make a meal plan that is best for you.  Keep in mind that carbohydrates and alcohol have immediate effects on your blood glucose levels. It is important to count carbohydrates and to use alcohol carefully. This information is not intended to replace advice given to you by your health care provider. Make sure you discuss any questions you have with your health care provider. Document Released: 05/03/2005 Document Revised: 09/10/2016 Document Reviewed: 09/10/2016 Elsevier Interactive Patient Education  [employer].

## 2017-09-08 ENCOUNTER — Encounter: Payer: Self-pay | Admitting: Physician Assistant

## 2017-09-08 DIAGNOSIS — E1121 Type 2 diabetes mellitus with diabetic nephropathy: Secondary | ICD-10-CM | POA: Insufficient documentation

## 2017-09-08 DIAGNOSIS — E785 Hyperlipidemia, unspecified: Secondary | ICD-10-CM | POA: Insufficient documentation

## 2017-09-08 DIAGNOSIS — Z79899 Other long term (current) drug therapy: Secondary | ICD-10-CM | POA: Insufficient documentation

## 2017-09-09 ENCOUNTER — Other Ambulatory Visit: Payer: Self-pay | Admitting: Physician Assistant

## 2017-10-18 ENCOUNTER — Other Ambulatory Visit: Payer: Self-pay

## 2017-10-18 DIAGNOSIS — F331 Major depressive disorder, recurrent, moderate: Secondary | ICD-10-CM

## 2017-10-18 MED ORDER — SERTRALINE HCL 50 MG PO TABS: 100.0000 mg | ORAL_TABLET | Freq: Every day | ORAL | 0 refills | Status: DC

## 2017-12-03 ENCOUNTER — Ambulatory Visit (INDEPENDENT_AMBULATORY_CARE_PROVIDER_SITE_OTHER): Payer: Medicare Other

## 2017-12-03 ENCOUNTER — Encounter: Payer: Self-pay | Admitting: Physician Assistant

## 2017-12-03 ENCOUNTER — Ambulatory Visit (INDEPENDENT_AMBULATORY_CARE_PROVIDER_SITE_OTHER): Payer: Medicare Other | Admitting: Physician Assistant

## 2017-12-03 VITALS — BP 137/64 | HR 84 | Temp 98.2°F | Resp 18 | Wt 146.0 lb

## 2017-12-03 DIAGNOSIS — J22 Unspecified acute lower respiratory infection: Secondary | ICD-10-CM

## 2017-12-03 DIAGNOSIS — Z1159 Encounter for screening for other viral diseases: Secondary | ICD-10-CM | POA: Diagnosis not present

## 2017-12-03 DIAGNOSIS — D649 Anemia, unspecified: Secondary | ICD-10-CM | POA: Diagnosis not present

## 2017-12-03 DIAGNOSIS — R7989 Other specified abnormal findings of blood chemistry: Secondary | ICD-10-CM

## 2017-12-03 DIAGNOSIS — Z114 Encounter for screening for human immunodeficiency virus [HIV]: Secondary | ICD-10-CM | POA: Diagnosis not present

## 2017-12-03 DIAGNOSIS — E113493 Type 2 diabetes mellitus with severe nonproliferative diabetic retinopathy without macular edema, bilateral: Secondary | ICD-10-CM | POA: Diagnosis not present

## 2017-12-03 DIAGNOSIS — F3341 Major depressive disorder, recurrent, in partial remission: Secondary | ICD-10-CM | POA: Diagnosis not present

## 2017-12-03 DIAGNOSIS — Z79899 Other long term (current) drug therapy: Secondary | ICD-10-CM | POA: Diagnosis not present

## 2017-12-03 DIAGNOSIS — N179 Acute kidney failure, unspecified: Secondary | ICD-10-CM | POA: Diagnosis not present

## 2017-12-03 DIAGNOSIS — R0602 Shortness of breath: Secondary | ICD-10-CM | POA: Diagnosis not present

## 2017-12-03 DIAGNOSIS — Z8639 Personal history of other endocrine, nutritional and metabolic disease: Secondary | ICD-10-CM

## 2017-12-03 DIAGNOSIS — R05 Cough: Secondary | ICD-10-CM

## 2017-12-03 DIAGNOSIS — R630 Anorexia: Secondary | ICD-10-CM

## 2017-12-03 MED ORDER — HYDROCOD POLST-CPM POLST ER 10-8 MG/5ML PO SUER
5.0000 mL | Freq: Two times a day (BID) | ORAL | 0 refills | Status: AC | PRN
Start: 2017-12-03 — End: 2017-12-08

## 2017-12-03 MED ORDER — PREDNISONE 20 MG PO TABS: 40.0000 mg | ORAL_TABLET | Freq: Every day | ORAL | 0 refills | Status: AC

## 2017-12-03 MED ORDER — ALBUTEROL SULFATE HFA 108 (90 BASE) MCG/ACT IN AERS: 1.0000 | INHALATION_SPRAY | RESPIRATORY_TRACT | 0 refills | Status: DC | PRN

## 2017-12-03 MED ORDER — SERTRALINE HCL 100 MG PO TABS: 100.0000 mg | ORAL_TABLET | Freq: Every day | ORAL | 1 refills | Status: DC

## 2017-12-03 MED ORDER — DOXYCYCLINE HYCLATE 100 MG PO TABS: 100.0000 mg | ORAL_TABLET | Freq: Two times a day (BID) | ORAL | 0 refills | Status: DC

## 2017-12-03 NOTE — Patient Instructions (Signed)

## 2017-12-03 NOTE — Progress Notes (Signed)
HPI:                                                                Nichole Fitzgerald is a 67 y.o. female who presents to [facility]: [facility] today for depression follow-up  History is provided by patient and her husband.  Additional concerns: cough, hypoglycemia  Depression/Anxiety: taking sertraline 100 mg without difficulty. Feels like her mood is slightly improved. Continues to endorse difficulty with sleep onset and maintenance. Reports poor sleep hygiene. Denies symptoms of mania/hypomania. Denies suicidal thinking. Denies auditory/visual hallucinations.  Cough: Reports non-productive, hacking cough x 10 days. Cough is unchanged. Associated with malaise, fatigue, rhinorrhea, and wheezing. Coughing to the point of emesis at time. No fever, chest pain, dyspnea, or hemoptysis. Has tried Mucinex, no relief. Reports she was told she had reactive-airway disease as a young adult and had pulmonary function testing done at that time. History of pneumonia in 1995. Has received both pneumococcal vaccines.    Hypoglycemia, Husband also reports 2 episodes of hypoglycemia in the 20's and 30's in which EMS had to be called. She has not been eating very much and grazes, but does not eat substantial meals. Attributes this to lack of appetite, nausea and diarrhea  Depression screen [facility] 2/9 12/03/2017 09/03/2017 03/28/2017 02/27/2017  Decreased Interest 1 2 0 2  Down, Depressed, Hopeless 0 1 0 2  PHQ - 2 Score 1 3 0 4  Altered sleeping 2 3 1 3   Tired, decreased energy 2 3 1 3   Change in appetite 1 1 1 2   Feeling bad or failure about yourself  0 0 0 1  Trouble concentrating 0 0 0 1  Moving slowly or fidgety/restless 0 0 0 0  Suicidal thoughts 0 0 0 1  PHQ-9 Score 6 10 3 15     GAD 7 : Generalized Anxiety Score 12/03/2017 09/03/2017 03/28/2017  Nervous, Anxious, on Edge 0 1 0  Control/stop worrying 0 1 1  Worry too much - different things 1 2 1   Trouble relaxing 0 1  0  Restless 0 1 0  Easily annoyed or irritable 0 1 0  Afraid - awful might happen 0 0 0  Total GAD 7 Score 1 7 2       Past Medical History:  Diagnosis Date  . Basal cell carcinoma (BCC) of face   . Chronic kidney disease   . Depression   . Diabetes mellitus without complication ([place])   . Diabetic nephropathy ([place])   . Diabetic retinopathy ([place])   . Eczema 02/28/2017  . Legally blind 02/27/2017  . Legally blind   . Osteopenia determined by x-ray 03/14/2017   Repeat DEXA 02/2018  . Postmenopausal 02/28/2017  . Scalp psoriasis 02/28/2017  . Severe non-proliferative diabetic retinopathy ([place])    Past Surgical History:  Procedure Laterality Date  . CATARACT EXTRACTION    . COLONOSCOPY  2015  . EYE SURGERY    . TUBAL LIGATION     Social History   Tobacco Use  . Smoking status: Never Smoker  . Smokeless tobacco: Never Used  Substance Use Topics  . Alcohol use: Yes    Comment: <1 drink/month   family history includes Alcohol abuse in her father; Breast cancer in her  mother; Cancer in her mother; Diabetes in her father; Heart attack in her paternal grandfather; Hypertension in her mother; Skin cancer in her mother; Stroke in her father.    ROS: negative except as noted in the HPI  Medications: Current Outpatient Medications  Medication Sig Dispense Refill  . AMBULATORY NON FORMULARY MEDICATION Single glucometer with lancets, test strips.  Check morning fasting blood sugar daily Diagnosis code: E11.3493, E11.21 1 each 0  . atorvastatin (LIPITOR) 10 MG tablet Take 0.5 tablets (5 mg total) by mouth at bedtime. 45 tablet 1  . betamethasone dipropionate 0.05 % lotion Apply topically 2 (two) times daily. 60 mL 0  . Calcium Carbonate-Vitamin D 600-400 MG-UNIT tablet Take 1 tablet by mouth 2 (two) times daily. 60 tablet 11  . Dapagliflozin-Metformin HCl ER (XIGDUO XR) 05-999 MG TB24 Take 1 tablet by mouth daily with breakfast. 90 tablet 1  . glimepiride (AMARYL) 4 MG tablet Take 1  tablet (4 mg total) by mouth daily with breakfast. 90 tablet 1  . losartan (COZAAR) 25 MG tablet Take 0.5 tablets (12.5 mg total) by mouth daily. 45 tablet 1  . Multiple Vitamins-Minerals (MULTIVITAMIN ADULT) CHEW Chew by mouth.    . mupirocin ointment (BACTROBAN) 2 % Apply 1 application topically 3 (three) times daily. 22 g 0  . nitroGLYCERIN (NITRODUR - DOSED IN MG/24 HR) 0.2 mg/hr patch 1/4 patch to heel for tendonitis 30 patch 1  . pimecrolimus (ELIDEL) 1 % cream Apply topically 2 (two) times daily as needed. 30 g 2  . sertraline (ZOLOFT) 50 MG tablet Take 2 tablets (100 mg total) by mouth at bedtime. 90 tablet 0  . albuterol (PROVENTIL HFA;VENTOLIN HFA) 108 (90 Base) MCG/ACT inhaler Inhale 1-2 puffs into the lungs every 4 (four) hours as needed for wheezing or shortness of breath (bronchospasm). 1 Inhaler 0  . doxycycline (VIBRA-TABS) 100 MG tablet Take 1 tablet (100 mg total) by mouth 2 (two) times daily for 7 days. 14 tablet 0  . predniSONE (DELTASONE) 20 MG tablet Take 2 tablets (40 mg total) by mouth daily with breakfast for 5 days. 10 tablet 0   No current facility-administered medications for this visit.    Allergies  Allergen Reactions  . Bydureon [Exenatide] Swelling    Lip swelling  . Lisinopril Cough       Objective:  BP 137/64   Pulse 84   Temp 98.2 F (36.8 C) (Oral)   Resp 18   Wt 146 lb (66.2 kg)   SpO2 95%   BMI 27.14 kg/m  Gen:  alert, appears pale, no distress, appropriate for age HEENT: head normocephalic without obvious abnormality, conjunctiva and cornea clear, TM's clear bilaterally, nasal mucosa edematous with clear rhinorrhea, oropharynx with erythema, no edema or exudates, neck supple, no cervical adenopathy,  trachea midline Pulm: Normal work of breathing, normal phonation, clear to auscultation bilaterally, no wheezes, rales or rhonchi CV: Normal rate, regular rhythm, s1 and s2 distinct, no murmurs, clicks or rubs  Neuro: alert and oriented x 3, no  tremor MSK: extremities atraumatic, normal gait and station Skin: intact, no rashes on exposed skin, no jaundice, no cyanosis Psych: well-groomed, cooperative, good eye contact, euthymic mood, affect mood-congruent, speech is articulate, and thought processes clear and goal-directed    No results found for this or any previous visit (from the past 72 hour(s)). No results found.    Assessment and Plan: 67 y.o. female with  Acute lower respiratory infection - SpO2 95% on RA at rest,  no evidence of respiratory distress. Risk factors include: diabetes and reactive airway disease. Treating empirically for pneumonia with Doxycycline. CXR today given relative hypoxia and risk factors. Symptomatic care with steroid, beta agonist, cough suppressant at bedtime  2. Moderate episode of recurrent major depressive disorder (HCC) - PHQ9=6, partial remission on Zoloft 100 mg   History of hypoglycemia, Decreased appetite - stopping glimeperide today. May dial back on Xigduo if needed. Counseled on closely monitoring blood glucose at home and ocrrecting for hypoglycemia with 15g of glucose. She is otherwise not on any other medications to cause appetitie disturbance or GI upset. Checking labs.   Acute lower respiratory infection - Plan: predniSONE (DELTASONE) 20 MG tablet, doxycycline (VIBRA-TABS) 100 MG tablet, albuterol (PROVENTIL HFA;VENTOLIN HFA) 108 (90 Base) MCG/ACT inhaler, chlorpheniramine-HYDROcodone (TUSSIONEX) 10-8 MG/5ML SUER, DG Chest 2 View  Recurrent major depressive disorder, in partial remission ([place]) - Plan: sertraline (ZOLOFT) 100 MG tablet  Decreased appetite - Plan: COMPLETE METABOLIC PANEL WITH GFR, CBC with Differential/Platelet, Lipase, Hemoglobin A1c, Hepatitis C antibody, HIV antibody  Type 2 diabetes mellitus with severe nonproliferative retinopathy of both eyes, without long-term current use of insulin, macular edema presence unspecified ([place]) - Plan: Hemoglobin  A1c  History of hypoglycemia - Plan: COMPLETE METABOLIC PANEL WITH GFR, CBC with Differential/Platelet, Lipase, Hemoglobin A1c  Encounter for hepatitis C screening test for low risk patient - Plan: Hepatitis C antibody  Encounter for screening for HIV - Plan: HIV antibody     Patient education and anticipatory guidance given Patient agrees with treatment plan Follow-up as needed if symptoms worsen or fail to improve  Darlyne Russian PA-C

## 2017-12-04 ENCOUNTER — Encounter: Payer: Self-pay | Admitting: Physician Assistant

## 2017-12-04 LAB — HEPATITIS C ANTIBODY
HEP C AB: NONREACTIVE
SIGNAL TO CUT-OFF: 0.02 (ref <1.00)

## 2017-12-04 LAB — CBC WITH DIFFERENTIAL/PLATELET
BASOS PCT: 0.2 %
Basophils Absolute: 21 cells/uL (ref 0–200)
Eosinophils Absolute: 139 cells/uL (ref 15–500)
Eosinophils Relative: 1.3 %
HCT: 33.9 % — ABNORMAL LOW (ref 35.0–45.0)
Hemoglobin: 11.2 g/dL — ABNORMAL LOW (ref 11.7–15.5)
Lymphs Abs: 1798 cells/uL (ref 850–3900)
MCH: 27.9 pg (ref 27.0–33.0)
MCHC: 33 g/dL (ref 32.0–36.0)
MCV: 84.5 fL (ref 80.0–100.0)
MONOS PCT: 6.4 %
MPV: 11.3 fL (ref 7.5–12.5)
Neutro Abs: 8057 cells/uL — ABNORMAL HIGH (ref 1500–7800)
Neutrophils Relative %: 75.3 %
PLATELETS: 251 10*3/uL (ref 140–400)
RBC: 4.01 10*6/uL (ref 3.80–5.10)
RDW: 13 % (ref 11.0–15.0)
TOTAL LYMPHOCYTE: 16.8 %
WBC mixed population: 685 cells/uL (ref 200–950)
WBC: 10.7 10*3/uL (ref 3.8–10.8)

## 2017-12-04 LAB — HEMOGLOBIN A1C
HEMOGLOBIN A1C: 6.3 %{Hb} — AB (ref <5.7)
Mean Plasma Glucose: 134 (calc)
eAG (mmol/L): 7.4 (calc)

## 2017-12-04 LAB — COMPLETE METABOLIC PANEL WITH GFR
AG RATIO: 1.2 (calc) (ref 1.0–2.5)
ALBUMIN MSPROF: 4 g/dL (ref 3.6–5.1)
ALT: 12 U/L (ref 6–29)
AST: 12 U/L (ref 10–35)
Alkaline phosphatase (APISO): 57 U/L (ref 33–130)
BUN / CREAT RATIO: 21 (calc) (ref 6–22)
BUN: 27 mg/dL — ABNORMAL HIGH (ref 7–25)
CALCIUM: 9.2 mg/dL (ref 8.6–10.4)
CO2: 27 mmol/L (ref 20–32)
CREATININE: 1.31 mg/dL — AB (ref 0.50–0.99)
Chloride: 105 mmol/L (ref 98–110)
GFR, EST AFRICAN AMERICAN: 49 mL/min/{1.73_m2} — AB (ref >=60)
GFR, EST NON AFRICAN AMERICAN: 43 mL/min/{1.73_m2} — AB (ref >=60)
GLOBULIN: 3.3 g/dL (ref 1.9–3.7)
Glucose, Bld: 185 mg/dL — ABNORMAL HIGH (ref 65–139)
POTASSIUM: 5.3 mmol/L (ref 3.5–5.3)
SODIUM: 140 mmol/L (ref 135–146)
TOTAL PROTEIN: 7.3 g/dL (ref 6.1–8.1)
Total Bilirubin: 0.4 mg/dL (ref 0.2–1.2)

## 2017-12-04 LAB — LIPASE: Lipase: 44 U/L (ref 7–60)

## 2017-12-04 LAB — HIV ANTIBODY (ROUTINE TESTING W REFLEX): HIV: NONREACTIVE

## 2017-12-05 ENCOUNTER — Encounter: Payer: Self-pay | Admitting: Physician Assistant

## 2017-12-05 DIAGNOSIS — D649 Anemia, unspecified: Secondary | ICD-10-CM | POA: Insufficient documentation

## 2017-12-05 DIAGNOSIS — N179 Acute kidney failure, unspecified: Secondary | ICD-10-CM | POA: Insufficient documentation

## 2017-12-05 NOTE — Progress Notes (Signed)
CXR was negative for pneumonia. But neutrophils were increased in the blood, suggesting bacterial infection. Continue antibiotic and steroid A1c is unchanged at 6.3. I think it is definitely okay to drop the Glimeperide as discussed There is some acute kidney injury, likely from dehydration. Drink plenty of fluids, recheck early next week There is also mild anemia

## 2017-12-05 NOTE — Addendum Note (Signed)
Addended by: Darlyne Russian on: 12/05/2017 08:34 AM   Modules accepted: Orders

## 2017-12-10 ENCOUNTER — Encounter: Payer: Self-pay | Admitting: Physician Assistant

## 2017-12-10 ENCOUNTER — Ambulatory Visit (INDEPENDENT_AMBULATORY_CARE_PROVIDER_SITE_OTHER): Payer: Medicare Other | Admitting: Physician Assistant

## 2017-12-10 VITALS — BP 97/64 | HR 87 | Temp 98.0°F | Resp 14 | Wt 142.0 lb

## 2017-12-10 DIAGNOSIS — R5383 Other fatigue: Secondary | ICD-10-CM

## 2017-12-10 DIAGNOSIS — D649 Anemia, unspecified: Secondary | ICD-10-CM | POA: Diagnosis not present

## 2017-12-10 DIAGNOSIS — R7989 Other specified abnormal findings of blood chemistry: Secondary | ICD-10-CM | POA: Diagnosis not present

## 2017-12-10 DIAGNOSIS — N179 Acute kidney failure, unspecified: Secondary | ICD-10-CM

## 2017-12-10 DIAGNOSIS — E113493 Type 2 diabetes mellitus with severe nonproliferative diabetic retinopathy without macular edema, bilateral: Secondary | ICD-10-CM | POA: Diagnosis not present

## 2017-12-10 DIAGNOSIS — R630 Anorexia: Secondary | ICD-10-CM | POA: Diagnosis not present

## 2017-12-10 LAB — BASIC METABOLIC PANEL WITH GFR
BUN/Creatinine Ratio: 33 (calc) — ABNORMAL HIGH (ref 6–22)
BUN: 49 mg/dL — ABNORMAL HIGH (ref 7–25)
CALCIUM: 9.9 mg/dL (ref 8.6–10.4)
CO2: 28 mmol/L (ref 20–32)
Chloride: 103 mmol/L (ref 98–110)
Creat: 1.49 mg/dL — ABNORMAL HIGH (ref 0.50–0.99)
GFR, EST AFRICAN AMERICAN: 42 mL/min/{1.73_m2} — AB (ref >=60)
GFR, EST NON AFRICAN AMERICAN: 36 mL/min/{1.73_m2} — AB (ref >=60)
Glucose, Bld: 165 mg/dL — ABNORMAL HIGH (ref 65–99)
POTASSIUM: 4.7 mmol/L (ref 3.5–5.3)
Sodium: 140 mmol/L (ref 135–146)

## 2017-12-10 NOTE — Progress Notes (Signed)
HPI:                                                                Nichole Fitzgerald is a 67 y.o. female who presents to [place]: [place] today for follow-up cough  Diagnosed with acute bronchitis 1 week ago, CXR negative on 12/03/17. Endorses some chest congestion and fatigue. Cough is improved, occasionally productive of clear sputum. Did not feel like Albuterol helped. Completed doxycycline and prednisone.  Her appetite is better. Reports morning fasting sugars 100-130 currently since stopping Glimeperide and taking Xigduo 05-999 at night. Denies hypoglycemic episodes.  Labs showed mild normocytic anemia and elevated Scr.   Depression screen [facility] 2/9 12/03/2017 09/03/2017 03/28/2017 02/27/2017  Decreased Interest 1 2 0 2  Down, Depressed, Hopeless 0 1 0 2  PHQ - 2 Score 1 3 0 4  Altered sleeping 2 3 1 3   Tired, decreased energy 2 3 1 3   Change in appetite 1 1 1 2   Feeling bad or failure about yourself  0 0 0 1  Trouble concentrating 0 0 0 1  Moving slowly or fidgety/restless 0 0 0 0  Suicidal thoughts 0 0 0 1  PHQ-9 Score 6 10 3 15     GAD 7 : Generalized Anxiety Score 12/03/2017 09/03/2017 03/28/2017  Nervous, Anxious, on Edge 0 1 0  Control/stop worrying 0 1 1  Worry too much - different things 1 2 1   Trouble relaxing 0 1 0  Restless 0 1 0  Easily annoyed or irritable 0 1 0  Afraid - awful might happen 0 0 0  Total GAD 7 Score 1 7 2       Past Medical History:  Diagnosis Date  . Basal cell carcinoma (BCC) of face   . Chronic kidney disease   . Depression   . Diabetes mellitus without complication ([place])   . Diabetic nephropathy ([place])   . Diabetic retinopathy ([place])   . Eczema 02/28/2017  . Legally blind 02/27/2017  . Legally blind   . Osteopenia determined by x-ray 03/14/2017   Repeat DEXA 02/2018  . Postmenopausal 02/28/2017  . Scalp psoriasis 02/28/2017  . Severe non-proliferative diabetic retinopathy ([place])    Past Surgical  History:  Procedure Laterality Date  . CATARACT EXTRACTION    . COLONOSCOPY  2015  . EYE SURGERY    . TUBAL LIGATION     Social History   Tobacco Use  . Smoking status: Never Smoker  . Smokeless tobacco: Never Used  Substance Use Topics  . Alcohol use: Yes    Comment: <1 drink/month   family history includes Alcohol abuse in her father; Breast cancer in her mother; Cancer in her mother; Diabetes in her father; Heart attack in her paternal grandfather; Hypertension in her mother; Skin cancer in her mother; Stroke in her father.    ROS: negative except as noted in the HPI  Medications: Current Outpatient Medications  Medication Sig Dispense Refill  . albuterol (PROVENTIL HFA;VENTOLIN HFA) 108 (90 Base) MCG/ACT inhaler Inhale 1-2 puffs into the lungs every 4 (four) hours as needed for wheezing or shortness of breath (bronchospasm). 1 Inhaler 0  . AMBULATORY NON FORMULARY MEDICATION Single glucometer with lancets, test strips.  Check morning fasting blood sugar  daily Diagnosis code: E11.3493, E11.21 1 each 0  . atorvastatin (LIPITOR) 10 MG tablet Take 0.5 tablets (5 mg total) by mouth at bedtime. 45 tablet 1  . betamethasone dipropionate 0.05 % lotion Apply topically 2 (two) times daily. 60 mL 0  . Calcium Carbonate-Vitamin D 600-400 MG-UNIT tablet Take 1 tablet by mouth 2 (two) times daily. 60 tablet 11  . Dapagliflozin-Metformin HCl ER (XIGDUO XR) 05-999 MG TB24 Take 1 tablet by mouth daily with breakfast. 90 tablet 1  . losartan (COZAAR) 25 MG tablet Take 0.5 tablets (12.5 mg total) by mouth daily. 45 tablet 1  . Multiple Vitamins-Minerals (MULTIVITAMIN ADULT) CHEW Chew by mouth.    . mupirocin ointment (BACTROBAN) 2 % Apply 1 application topically 3 (three) times daily. 22 g 0  . nitroGLYCERIN (NITRODUR - DOSED IN MG/24 HR) 0.2 mg/hr patch 1/4 patch to heel for tendonitis 30 patch 1  . pimecrolimus (ELIDEL) 1 % cream Apply topically 2 (two) times daily as needed. 30 g 2  .  sertraline (ZOLOFT) 100 MG tablet Take 1 tablet (100 mg total) by mouth at bedtime. 90 tablet 1   No current facility-administered medications for this visit.    Allergies  Allergen Reactions  . Bydureon [Exenatide] Swelling    Lip swelling  . Lisinopril Cough       Objective:  BP 97/64   Pulse 87   Wt 142 lb (64.4 kg)   BMI 26.40 kg/m  Gen:  alert, appears pale, no distress, appropriate for age HEENT: head normocephalic without obvious abnormality, conjunctiva and cornea clear, trachea midline Pulm: Normal work of breathing, normal phonation, clear to auscultation bilaterally, no wheezes, rales or rhonchi CV: Normal rate, regular rhythm, s1 and s2 distinct, no murmurs, clicks or rubs  Neuro: alert and oriented x 3, no tremor MSK: extremities atraumatic, normal gait and station Skin: intact, no rashes on exposed skin, no jaundice, no cyanosis Psych: well-groomed, cooperative, good eye contact, euthymic mood, affect mood-congruent, speech is articulate, and thought processes clear and goal-directed  Lab Results  Component Value Date   WBC 10.7 12/03/2017   HGB 11.2 (L) 12/03/2017   HCT 33.9 (L) 12/03/2017   MCV 84.5 12/03/2017   PLT 251 12/03/2017   Lab Results  Component Value Date   CREATININE 1.31 (H) 12/03/2017   BUN 27 (H) 12/03/2017   NA 140 12/03/2017   K 5.3 12/03/2017   CL 105 12/03/2017   CO2 27 12/03/2017    Lab Results  Component Value Date   HGBA1C 6.3 (H) 12/03/2017    No results found for this or any previous visit (from the past 72 hour(s)). No results found.    Assessment and Plan: 67 y.o. female with   AKI Lab Results  Component Value Date   CREATININE 1.31 (H) 12/03/2017  - felt to be pre-renal from acute illness/dehydration. Rechecking renal function today   Decreased appetite Wt Readings from Last 3 Encounters:  12/10/17 142 lb (64.4 kg)  12/03/17 146 lb (66.2 kg)  09/03/17 146 lb 3.2 oz (66.3 kg)  - has lost 4 pounds in the  last week - counseled on diabetes and nutrition  Normocytic anemia Lab Results  Component Value Date   HGB 11.2 (L) 12/03/2017  - likely anemia of chronic disease. Checking iron panel  Acute kidney injury ([place]) - Plan: BASIC METABOLIC PANEL WITH GFR, Phosphorus, PTH, Intact and Calcium  Other fatigue  Decreased appetite  Type 2 diabetes mellitus with severe nonproliferative retinopathy  of both eyes, without long-term current use of insulin, macular edema presence unspecified (HCC)  Normocytic anemia - Plan: Fe+TIBC+Fer  Elevated serum creatinine - Plan: BASIC METABOLIC PANEL WITH GFR, Phosphorus, PTH, Intact and Calcium   Patient education and anticipatory guidance given Patient agrees with treatment plan Follow-up in 3 months for medication management or sooner as needed if symptoms worsen or fail to improve  Darlyne Russian PA-C

## 2017-12-10 NOTE — Patient Instructions (Signed)
Diabetes Mellitus and Nutrition When you have diabetes (diabetes mellitus), it is very important to have healthy eating habits because your blood sugar (glucose) levels are greatly affected by what you eat and drink. Eating healthy foods in the appropriate amounts, at about the same times every day, can help you:  Control your blood glucose.  Lower your risk of heart disease.  Improve your blood pressure.  Reach or maintain a healthy weight.  Every person with diabetes is different, and each person has different needs for a meal plan. Your health care provider may recommend that you work with a diet and nutrition specialist (dietitian) to make a meal plan that is best for you. Your meal plan may vary depending on factors such as:  The calories you need.  The medicines you take.  Your weight.  Your blood glucose, blood pressure, and cholesterol levels.  Your activity level.  Other health conditions you have, such as heart or kidney disease.  How do carbohydrates affect me? Carbohydrates affect your blood glucose level more than any other type of food. Eating carbohydrates naturally increases the amount of glucose in your blood. Carbohydrate counting is a method for keeping track of how many carbohydrates you eat. Counting carbohydrates is important to keep your blood glucose at a healthy level, especially if you use insulin or take certain oral diabetes medicines. It is important to know how many carbohydrates you can safely have in each meal. This is different for every person. Your dietitian can help you calculate how many carbohydrates you should have at each meal and for snack. Foods that contain carbohydrates include:  Bread, cereal, rice, pasta, and crackers.  Potatoes and corn.  Peas, beans, and lentils.  Milk and yogurt.  Fruit and juice.  Desserts, such as cakes, cookies, ice cream, and candy.  How does alcohol affect me? Alcohol can cause a sudden decrease in blood  glucose (hypoglycemia), especially if you use insulin or take certain oral diabetes medicines. Hypoglycemia can be a life-threatening condition. Symptoms of hypoglycemia (sleepiness, dizziness, and confusion) are similar to symptoms of having too much alcohol. If your health care provider says that alcohol is safe for you, follow these guidelines:  Limit alcohol intake to no more than 1 drink per day for nonpregnant women and 2 drinks per day for men. One drink equals 12 oz of beer, 5 oz of wine, or 1 oz of hard liquor.  Do not drink on an empty stomach.  Keep yourself hydrated with water, diet soda, or unsweetened iced tea.  Keep in mind that regular soda, juice, and other mixers may contain a lot of sugar and must be counted as carbohydrates.  What are tips for following this plan? Reading food labels  Start by checking the serving size on the label. The amount of calories, carbohydrates, fats, and other nutrients listed on the label are based on one serving of the food. Many foods contain more than one serving per package.  Check the total grams (g) of carbohydrates in one serving. You can calculate the number of servings of carbohydrates in one serving by dividing the total carbohydrates by 15. For example, if a food has 30 g of total carbohydrates, it would be equal to 2 servings of carbohydrates.  Check the number of grams (g) of saturated and trans fats in one serving. Choose foods that have low or no amount of these fats.  Check the number of milligrams (mg) of sodium in one serving. Most people   should limit total sodium intake to less than 2,300 mg per day.  Always check the nutrition information of foods labeled as "low-fat" or "nonfat". These foods may be higher in added sugar or refined carbohydrates and should be avoided.  Talk to your dietitian to identify your daily goals for nutrients listed on the label. Shopping  Avoid buying canned, premade, or processed foods. These  foods tend to be high in fat, sodium, and added sugar.  Shop around the outside edge of the grocery store. This includes fresh fruits and vegetables, bulk grains, fresh meats, and fresh dairy. Cooking  Use low-heat cooking methods, such as baking, instead of high-heat cooking methods like deep frying.  Cook using healthy oils, such as olive, canola, or sunflower oil.  Avoid cooking with butter, cream, or high-fat meats. Meal planning  Eat meals and snacks regularly, preferably at the same times every day. Avoid going long periods of time without eating.  Eat foods high in fiber, such as fresh fruits, vegetables, beans, and whole grains. Talk to your dietitian about how many servings of carbohydrates you can eat at each meal.  Eat 4-6 ounces of lean protein each day, such as lean meat, chicken, fish, eggs, or tofu. 1 ounce is equal to 1 ounce of meat, chicken, or fish, 1 egg, or 1/4 cup of tofu.  Eat some foods each day that contain healthy fats, such as avocado, nuts, seeds, and fish. Lifestyle   Check your blood glucose regularly.  Exercise at least 30 minutes 5 or more days each week, or as told by your health care provider.  Take medicines as told by your health care provider.  Do not use any products that contain nicotine or tobacco, such as cigarettes and e-cigarettes. If you need help quitting, ask your health care provider.  Work with a counselor or diabetes educator to identify strategies to manage stress and any emotional and social challenges. What are some questions to ask my health care provider?  Do I need to meet with a diabetes educator?  Do I need to meet with a dietitian?  What number can I call if I have questions?  When are the best times to check my blood glucose? Where to find more information:  American Diabetes Association: diabetes.org/food-and-fitness/food  Academy of Nutrition and Dietetics:  www.eatright.org/resources/health/diseases-and-conditions/diabetes  National Institute of Diabetes and Digestive and Kidney Diseases (NIH): www.niddk.nih.gov/health-information/diabetes/overview/diet-eating-physical-activity Summary  A healthy meal plan will help you control your blood glucose and maintain a healthy lifestyle.  Working with a diet and nutrition specialist (dietitian) can help you make a meal plan that is best for you.  Keep in mind that carbohydrates and alcohol have immediate effects on your blood glucose levels. It is important to count carbohydrates and to use alcohol carefully. This information is not intended to replace advice given to you by your health care provider. Make sure you discuss any questions you have with your health care provider. Document Released: 05/03/2005 Document Revised: 09/10/2016 Document Reviewed: 09/10/2016 Elsevier Interactive Patient Education  2018 Elsevier Inc.  

## 2017-12-11 LAB — IRON,TIBC AND FERRITIN PANEL
%SAT: 22 % (ref 11–50)
Ferritin: 69 ng/mL (ref 20–288)
IRON: 66 ug/dL (ref 45–160)
TIBC: 294 ug/dL (ref 250–450)

## 2017-12-11 LAB — PHOSPHORUS: Phosphorus: 5.4 mg/dL — ABNORMAL HIGH (ref 2.1–4.3)

## 2017-12-11 LAB — PTH, INTACT AND CALCIUM
Calcium: 9.9 mg/dL (ref 8.6–10.4)
PTH: 17 pg/mL (ref 14–64)

## 2017-12-13 NOTE — Progress Notes (Signed)
Kidney function has worsened I feel she needs IV fluids. Let's get her scheduled for Monday for 1L of NS infusion Instruct her to try to drink 2L of water daily over the weekend Avoid anything that can be dehydrating including caffeine Avoid over-the-counter medications without checking with me first

## 2017-12-16 ENCOUNTER — Ambulatory Visit (INDEPENDENT_AMBULATORY_CARE_PROVIDER_SITE_OTHER): Payer: Medicare Other | Admitting: Physician Assistant

## 2017-12-16 VITALS — BP 115/69 | HR 84 | Temp 98.2°F | Wt 143.0 lb

## 2017-12-16 DIAGNOSIS — N179 Acute kidney failure, unspecified: Secondary | ICD-10-CM | POA: Diagnosis not present

## 2017-12-16 NOTE — Patient Instructions (Signed)

## 2017-12-16 NOTE — Progress Notes (Signed)
Patient ID: Nichole Fitzgerald, female   DOB: 1950-12-03, 67 y.o.   MRN: [id number]  Patient presents today for IV fluid resuscitation for acute kidney injury in the setting of Type 2 diabetes with diabetic nephropathy (Scr 1.49, GFR 36, 12/10/17)   Vitals:   12/16/17 1114  BP: 115/69  Pulse: 84  Temp: 98.2 F (36.8 C)    Inserted #20 gauge, 1-inch BD Insyte angiocatheter in the left antecubital vein on 1 attempt. Good blood return. Flushed with normal saline.   1L NS infused over 60 minutes. Patient tolerated infusion well with no immediate complications.  Acute kidney injury (nontraumatic) ([place]) - Plan: Renal function panel - avoiding nephrotoxic drugs - push PO fluids - recheck renal function tomorrow. Once stable, monitor Q62months   Nelson Chimes PA-C

## 2017-12-17 DIAGNOSIS — N179 Acute kidney failure, unspecified: Secondary | ICD-10-CM | POA: Diagnosis not present

## 2017-12-18 LAB — RENAL FUNCTION PANEL
Albumin: 3.8 g/dL (ref 3.6–5.1)
BUN / CREAT RATIO: 21 (calc) (ref 6–22)
BUN: 21 mg/dL (ref 7–25)
CHLORIDE: 105 mmol/L (ref 98–110)
CO2: 28 mmol/L (ref 20–32)
Calcium: 9 mg/dL (ref 8.6–10.4)
Creat: 1 mg/dL — ABNORMAL HIGH (ref 0.50–0.99)
GLUCOSE: 140 mg/dL — AB (ref 65–99)
Phosphorus: 4 mg/dL (ref 2.1–4.3)
Potassium: 4.3 mmol/L (ref 3.5–5.3)
SODIUM: 141 mmol/L (ref 135–146)

## 2017-12-18 NOTE — Progress Notes (Signed)
Kidney function is almost back to baseline. Definitely improved. Continue staying hydrated (at least 1.5-2 L of water per day)

## 2017-12-19 ENCOUNTER — Encounter: Payer: Self-pay | Admitting: Physician Assistant

## 2017-12-25 ENCOUNTER — Other Ambulatory Visit: Payer: Self-pay | Admitting: Physician Assistant

## 2017-12-25 DIAGNOSIS — J22 Unspecified acute lower respiratory infection: Secondary | ICD-10-CM

## 2018-01-28 ENCOUNTER — Encounter: Payer: Self-pay | Admitting: Physician Assistant

## 2018-01-28 NOTE — Telephone Encounter (Signed)
Error

## 2018-03-11 ENCOUNTER — Ambulatory Visit (INDEPENDENT_AMBULATORY_CARE_PROVIDER_SITE_OTHER): Payer: Medicare Other | Admitting: Physician Assistant

## 2018-03-11 ENCOUNTER — Encounter: Payer: Self-pay | Admitting: Physician Assistant

## 2018-03-11 VITALS — BP 124/78 | HR 70 | Wt 146.0 lb

## 2018-03-11 DIAGNOSIS — E113493 Type 2 diabetes mellitus with severe nonproliferative diabetic retinopathy without macular edema, bilateral: Secondary | ICD-10-CM | POA: Diagnosis not present

## 2018-03-11 DIAGNOSIS — E1121 Type 2 diabetes mellitus with diabetic nephropathy: Secondary | ICD-10-CM | POA: Diagnosis not present

## 2018-03-11 LAB — BASIC METABOLIC PANEL WITH GFR
BUN/Creatinine Ratio: 26 (calc) — ABNORMAL HIGH (ref 6–22)
BUN: 33 mg/dL — AB (ref 7–25)
CO2: 28 mmol/L (ref 20–32)
CREATININE: 1.26 mg/dL — AB (ref 0.50–0.99)
Calcium: 9.3 mg/dL (ref 8.6–10.4)
Chloride: 102 mmol/L (ref 98–110)
GFR, EST NON AFRICAN AMERICAN: 44 mL/min/{1.73_m2} — AB (ref >=60)
GFR, Est African American: 51 mL/min/{1.73_m2} — ABNORMAL LOW (ref >=60)
Glucose, Bld: 160 mg/dL — ABNORMAL HIGH (ref 65–99)
Potassium: 4.6 mmol/L (ref 3.5–5.3)
SODIUM: 139 mmol/L (ref 135–146)

## 2018-03-11 LAB — POCT GLYCOSYLATED HEMOGLOBIN (HGB A1C): HbA1c, POC (controlled diabetic range): 8 % — AB (ref 0.0–7.0)

## 2018-03-11 MED ORDER — DAPAGLIFLOZIN PRO-METFORMIN ER 5-1000 MG PO TB24: 10.0000 mg | ORAL_TABLET | Freq: Every day | ORAL | 1 refills | Status: DC

## 2018-03-11 NOTE — Patient Instructions (Signed)
Diabetes Mellitus and Nutrition When you have diabetes (diabetes mellitus), it is very important to have healthy eating habits because your blood sugar (glucose) levels are greatly affected by what you eat and drink. Eating healthy foods in the appropriate amounts, at about the same times every day, can help you:  Control your blood glucose.  Lower your risk of heart disease.  Improve your blood pressure.  Reach or maintain a healthy weight.  Every person with diabetes is different, and each person has different needs for a meal plan. Your health care provider may recommend that you work with a diet and nutrition specialist (dietitian) to make a meal plan that is best for you. Your meal plan may vary depending on factors such as:  The calories you need.  The medicines you take.  Your weight.  Your blood glucose, blood pressure, and cholesterol levels.  Your activity level.  Other health conditions you have, such as heart or kidney disease.  How do carbohydrates affect me? Carbohydrates affect your blood glucose level more than any other type of food. Eating carbohydrates naturally increases the amount of glucose in your blood. Carbohydrate counting is a method for keeping track of how many carbohydrates you eat. Counting carbohydrates is important to keep your blood glucose at a healthy level, especially if you use insulin or take certain oral diabetes medicines. It is important to know how many carbohydrates you can safely have in each meal. This is different for every person. Your dietitian can help you calculate how many carbohydrates you should have at each meal and for snack. Foods that contain carbohydrates include:  Bread, cereal, rice, pasta, and crackers.  Potatoes and corn.  Peas, beans, and lentils.  Milk and yogurt.  Fruit and juice.  Desserts, such as cakes, cookies, ice cream, and candy.  How does alcohol affect me? Alcohol can cause a sudden decrease in blood  glucose (hypoglycemia), especially if you use insulin or take certain oral diabetes medicines. Hypoglycemia can be a life-threatening condition. Symptoms of hypoglycemia (sleepiness, dizziness, and confusion) are similar to symptoms of having too much alcohol. If your health care provider says that alcohol is safe for you, follow these guidelines:  Limit alcohol intake to no more than 1 drink per day for nonpregnant women and 2 drinks per day for men. One drink equals 12 oz of beer, 5 oz of wine, or 1 oz of hard liquor.  Do not drink on an empty stomach.  Keep yourself hydrated with water, diet soda, or unsweetened iced tea.  Keep in mind that regular soda, juice, and other mixers may contain a lot of sugar and must be counted as carbohydrates.  What are tips for following this plan? Reading food labels  Start by checking the serving size on the label. The amount of calories, carbohydrates, fats, and other nutrients listed on the label are based on one serving of the food. Many foods contain more than one serving per package.  Check the total grams (g) of carbohydrates in one serving. You can calculate the number of servings of carbohydrates in one serving by dividing the total carbohydrates by 15. For example, if a food has 30 g of total carbohydrates, it would be equal to 2 servings of carbohydrates.  Check the number of grams (g) of saturated and trans fats in one serving. Choose foods that have low or no amount of these fats.  Check the number of milligrams (mg) of sodium in one serving. Most people   should limit total sodium intake to less than 2,300 mg per day.  Always check the nutrition information of foods labeled as "low-fat" or "nonfat". These foods may be higher in added sugar or refined carbohydrates and should be avoided.  Talk to your dietitian to identify your daily goals for nutrients listed on the label. Shopping  Avoid buying canned, premade, or processed foods. These  foods tend to be high in fat, sodium, and added sugar.  Shop around the outside edge of the grocery store. This includes fresh fruits and vegetables, bulk grains, fresh meats, and fresh dairy. Cooking  Use low-heat cooking methods, such as baking, instead of high-heat cooking methods like deep frying.  Cook using healthy oils, such as olive, canola, or sunflower oil.  Avoid cooking with butter, cream, or high-fat meats. Meal planning  Eat meals and snacks regularly, preferably at the same times every day. Avoid going long periods of time without eating.  Eat foods high in fiber, such as fresh fruits, vegetables, beans, and whole grains. Talk to your dietitian about how many servings of carbohydrates you can eat at each meal.  Eat 4-6 ounces of lean protein each day, such as lean meat, chicken, fish, eggs, or tofu. 1 ounce is equal to 1 ounce of meat, chicken, or fish, 1 egg, or 1/4 cup of tofu.  Eat some foods each day that contain healthy fats, such as avocado, nuts, seeds, and fish. Lifestyle   Check your blood glucose regularly.  Exercise at least 30 minutes 5 or more days each week, or as told by your health care provider.  Take medicines as told by your health care provider.  Do not use any products that contain nicotine or tobacco, such as cigarettes and e-cigarettes. If you need help quitting, ask your health care provider.  Work with a counselor or diabetes educator to identify strategies to manage stress and any emotional and social challenges. What are some questions to ask my health care provider?  Do I need to meet with a diabetes educator?  Do I need to meet with a dietitian?  What number can I call if I have questions?  When are the best times to check my blood glucose? Where to find more information:  American Diabetes Association: diabetes.org/food-and-fitness/food  Academy of Nutrition and Dietetics:  www.eatright.org/resources/health/diseases-and-conditions/diabetes  National Institute of Diabetes and Digestive and Kidney Diseases (NIH): www.niddk.nih.gov/health-information/diabetes/overview/diet-eating-physical-activity Summary  A healthy meal plan will help you control your blood glucose and maintain a healthy lifestyle.  Working with a diet and nutrition specialist (dietitian) can help you make a meal plan that is best for you.  Keep in mind that carbohydrates and alcohol have immediate effects on your blood glucose levels. It is important to count carbohydrates and to use alcohol carefully. This information is not intended to replace advice given to you by your health care provider. Make sure you discuss any questions you have with your health care provider. Document Released: 05/03/2005 Document Revised: 09/10/2016 Document Reviewed: 09/10/2016 Elsevier Interactive Patient Education  2018 Elsevier Inc.  

## 2018-03-11 NOTE — Progress Notes (Signed)
HPI:                                                                Nichole Fitzgerald is a 67 y.o. female who presents to [place]: [place] today for medication management   DMII: taking Xigduo 05-999 daily. Compliant with medications. She had some hypoglycemia 3 month sago and her Glimeperide was discontinued. Denies polydipsia, polyuria, polyphagia. Denies blurred vision or vision change. Denies extremity pain, altered sensation and paresthesias.  Denies ulcers/wounds on feet. Hx of DKA/HHS: never Diabetes associated symptoms: severe retinopathy, legally blind Blood glucose readings: 140's- 170's fasting AM Hypoglycemia frequency: none Severe hypoglycemia (requiring 3rd party assistance): once, April 2019   Depression screen [facility] 2/9 12/03/2017 09/03/2017 03/28/2017 02/27/2017  Decreased Interest 1 2 0 2  Down, Depressed, Hopeless 0 1 0 2  PHQ - 2 Score 1 3 0 4  Altered sleeping 2 3 1 3   Tired, decreased energy 2 3 1 3   Change in appetite 1 1 1 2   Feeling bad or failure about yourself  0 0 0 1  Trouble concentrating 0 0 0 1  Moving slowly or fidgety/restless 0 0 0 0  Suicidal thoughts 0 0 0 1  PHQ-9 Score 6 10 3 15     GAD 7 : Generalized Anxiety Score 12/03/2017 09/03/2017 03/28/2017  Nervous, Anxious, on Edge 0 1 0  Control/stop worrying 0 1 1  Worry too much - different things 1 2 1   Trouble relaxing 0 1 0  Restless 0 1 0  Easily annoyed or irritable 0 1 0  Afraid - awful might happen 0 0 0  Total GAD 7 Score 1 7 2       Past Medical History:  Diagnosis Date  . Basal cell carcinoma (BCC) of face   . Chronic kidney disease   . Depression   . Diabetes mellitus without complication ([place])   . Diabetic nephropathy ([place])   . Diabetic retinopathy ([place])   . Eczema 02/28/2017  . Legally blind 02/27/2017  . Legally blind   . Osteopenia determined by x-ray 03/14/2017   Repeat DEXA 02/2018  . Postmenopausal 02/28/2017  . Scalp psoriasis  02/28/2017  . Severe non-proliferative diabetic retinopathy ([place])    Past Surgical History:  Procedure Laterality Date  . CATARACT EXTRACTION    . COLONOSCOPY  2015  . EYE SURGERY    . TUBAL LIGATION     Social History   Tobacco Use  . Smoking status: Never Smoker  . Smokeless tobacco: Never Used  Substance Use Topics  . Alcohol use: Yes    Comment: <1 drink/month   family history includes Alcohol abuse in her father; Breast cancer in her mother; Cancer in her mother; Diabetes in her father; Heart attack in her paternal grandfather; Hypertension in her mother; Skin cancer in her mother; Stroke in her father.    ROS: negative except as noted in the HPI  Medications: Current Outpatient Medications  Medication Sig Dispense Refill  . AMBULATORY NON FORMULARY MEDICATION Single glucometer with lancets, test strips.  Check morning fasting blood sugar daily Diagnosis code: E11.3493, E11.21 1 each 0  . atorvastatin (LIPITOR) 10 MG tablet Take 0.5 tablets (5 mg total) by mouth at bedtime. 45 tablet  1  . betamethasone dipropionate 0.05 % lotion Apply topically 2 (two) times daily. 60 mL 0  . Calcium Carbonate-Vitamin D 600-400 MG-UNIT tablet Take 1 tablet by mouth 2 (two) times daily. 60 tablet 11  . Dapagliflozin-Metformin HCl ER (XIGDUO XR) 05-999 MG TB24 Take 1 tablet by mouth daily with breakfast. 90 tablet 1  . losartan (COZAAR) 25 MG tablet Take 0.5 tablets (12.5 mg total) by mouth daily. 45 tablet 1  . Multiple Vitamins-Minerals (MULTIVITAMIN ADULT) CHEW Chew by mouth.    . mupirocin ointment (BACTROBAN) 2 % Apply 1 application topically 3 (three) times daily. 22 g 0  . nitroGLYCERIN (NITRODUR - DOSED IN MG/24 HR) 0.2 mg/hr patch 1/4 patch to heel for tendonitis 30 patch 1  . pimecrolimus (ELIDEL) 1 % cream Apply topically 2 (two) times daily as needed. 30 g 2  . sertraline (ZOLOFT) 100 MG tablet Take 1 tablet (100 mg total) by mouth at bedtime. 90 tablet 1  . VENTOLIN HFA 108  (90 Base) MCG/ACT inhaler INHALE 1-2 PUFFS INTO THE LUNGS EVERY 4 (FOUR) HOURS AS NEEDED FOR WHEEZING OR SHORTNESS OF BREATH 1 Inhaler 5   No current facility-administered medications for this visit.    Allergies  Allergen Reactions  . Bydureon [Exenatide] Swelling    Lip swelling  . Lisinopril Cough       Objective:  BP 124/78   Pulse 70   Wt 146 lb (66.2 kg)   BMI 27.14 kg/m  Gen:  alert, not ill-appearing, no distress, appropriate for age 68: head normocephalic without obvious abnormality, conjunctiva and cornea clear, trachea midline, no carotid bruit Pulm: Normal work of breathing, normal phonation, clear to auscultation bilaterally, no wheezes, rales or rhonchi CV: Normal rate, regular rhythm, s1 and s2 distinct, no murmurs, clicks or rubs  Neuro: alert and oriented x 3, no tremor MSK: extremities atraumatic, normal gait and station, no peripheral edema Skin: intact, no rashes on exposed skin, no jaundice, no cyanosis Psych: well-groomed, cooperative, good eye contact, euthymic mood, affect mood-congruent, speech is articulate, and thought processes clear and goal-directed    No results found for this or any previous visit (from the past 72 hour(s)). No results found.    Assessment and Plan: 67 y.o. female with   Type 2 diabetes mellitus with severe nonproliferative retinopathy of both eyes, without long-term current use of insulin, macular edema presence unspecified ([place]) - Plan: POCT HgB A1C  Diabetic nephropathy associated with type 2 diabetes mellitus ([place]) - Plan: BASIC METABOLIC PANEL WITH GFR  Lab Results  Component Value Date   HGBA1C 8.0 (A) 03/11/2018  - A1c has increased from 6.3 to 8.0 in 3 months. Patient was struggling with hypoglycemia at the time and her Glimeperide was d/c'd. Increased Xigduo to 05-1999 at supper - checking renal function today - BP at goal, cont ARB - cont statin - immunizations UTD  Patient education and anticipatory  guidance given Patient agrees with treatment plan Follow-up in 3 months or sooner  as needed if symptoms worsen or fail to improve  Darlyne Russian PA-C

## 2018-03-14 ENCOUNTER — Encounter: Payer: Self-pay | Admitting: Physician Assistant

## 2018-04-01 ENCOUNTER — Other Ambulatory Visit: Payer: Self-pay | Admitting: Physician Assistant

## 2018-04-01 DIAGNOSIS — E1121 Type 2 diabetes mellitus with diabetic nephropathy: Secondary | ICD-10-CM

## 2018-04-03 ENCOUNTER — Other Ambulatory Visit: Payer: Self-pay | Admitting: Physician Assistant

## 2018-04-03 DIAGNOSIS — E113493 Type 2 diabetes mellitus with severe nonproliferative diabetic retinopathy without macular edema, bilateral: Secondary | ICD-10-CM

## 2018-06-01 ENCOUNTER — Other Ambulatory Visit: Payer: Self-pay | Admitting: Physician Assistant

## 2018-06-01 DIAGNOSIS — F3341 Major depressive disorder, recurrent, in partial remission: Secondary | ICD-10-CM

## 2018-06-02 NOTE — Telephone Encounter (Signed)
Okay to refill 30 days, patient was advised to follow-up 3 months from last visit, she is due to see Evlyn Clines

## 2018-06-04 ENCOUNTER — Other Ambulatory Visit: Payer: Self-pay | Admitting: Physician Assistant

## 2018-06-10 DIAGNOSIS — H35373 Puckering of macula, bilateral: Secondary | ICD-10-CM | POA: Diagnosis not present

## 2018-06-10 DIAGNOSIS — H43811 Vitreous degeneration, right eye: Secondary | ICD-10-CM | POA: Diagnosis not present

## 2018-06-10 DIAGNOSIS — H211X3 Other vascular disorders of iris and ciliary body, bilateral: Secondary | ICD-10-CM | POA: Diagnosis not present

## 2018-06-10 DIAGNOSIS — E113513 Type 2 diabetes mellitus with proliferative diabetic retinopathy with macular edema, bilateral: Secondary | ICD-10-CM | POA: Diagnosis not present

## 2018-06-17 ENCOUNTER — Encounter: Payer: Self-pay | Admitting: Physician Assistant

## 2018-06-17 ENCOUNTER — Ambulatory Visit (INDEPENDENT_AMBULATORY_CARE_PROVIDER_SITE_OTHER): Payer: Medicare Other | Admitting: Physician Assistant

## 2018-06-17 VITALS — BP 107/70 | HR 81 | Wt 127.0 lb

## 2018-06-17 DIAGNOSIS — R7989 Other specified abnormal findings of blood chemistry: Secondary | ICD-10-CM

## 2018-06-17 DIAGNOSIS — R112 Nausea with vomiting, unspecified: Secondary | ICD-10-CM

## 2018-06-17 DIAGNOSIS — F3341 Major depressive disorder, recurrent, in partial remission: Secondary | ICD-10-CM

## 2018-06-17 DIAGNOSIS — E1121 Type 2 diabetes mellitus with diabetic nephropathy: Secondary | ICD-10-CM

## 2018-06-17 DIAGNOSIS — Z23 Encounter for immunization: Secondary | ICD-10-CM | POA: Diagnosis not present

## 2018-06-17 DIAGNOSIS — N189 Chronic kidney disease, unspecified: Secondary | ICD-10-CM

## 2018-06-17 DIAGNOSIS — L409 Psoriasis, unspecified: Secondary | ICD-10-CM

## 2018-06-17 DIAGNOSIS — E113493 Type 2 diabetes mellitus with severe nonproliferative diabetic retinopathy without macular edema, bilateral: Secondary | ICD-10-CM

## 2018-06-17 LAB — POCT GLYCOSYLATED HEMOGLOBIN (HGB A1C): HbA1c, POC (prediabetic range): 6.2 % (ref 5.7–6.4)

## 2018-06-17 MED ORDER — LOSARTAN POTASSIUM 25 MG PO TABS: 12.5000 mg | ORAL_TABLET | Freq: Every day | ORAL | 1 refills | Status: DC

## 2018-06-17 MED ORDER — BETAMETHASONE DIPROPIONATE 0.05 % EX LOTN: TOPICAL_LOTION | Freq: Two times a day (BID) | CUTANEOUS | 0 refills | Status: AC

## 2018-06-17 MED ORDER — SERTRALINE HCL 100 MG PO TABS: 100.0000 mg | ORAL_TABLET | Freq: Every day | ORAL | 1 refills | Status: DC

## 2018-06-17 MED ORDER — ONDANSETRON 4 MG PO TBDP: 4.0000 mg | ORAL_TABLET | Freq: Four times a day (QID) | ORAL | 0 refills | Status: AC | PRN

## 2018-06-17 MED ORDER — METFORMIN HCL ER 500 MG PO TB24: 500.0000 mg | ORAL_TABLET | Freq: Two times a day (BID) | ORAL | 1 refills | Status: DC

## 2018-06-17 MED ORDER — DAPAGLIFLOZIN PROPANEDIOL 10 MG PO TABS: 10.0000 mg | ORAL_TABLET | Freq: Every day | ORAL | 1 refills | Status: DC

## 2018-06-17 NOTE — Progress Notes (Signed)
HPI:                                                                Nichole Fitzgerald is a 67 y.o. female who presents to [place]: [place] today for medication management  DMII:  [name] was increased to 05-1999 with supper. She is having more GI upset. Compliant with medications. She had some hypoglycemia 6 month sago and her Glimeperide was discontinued. Denies polydipsia, polyuria, polyphagia. She has severe retinopathy and is legally blind; denies vision change. Denies extremity pain, altered sensation and paresthesias.  Denies ulcers/wounds on feet. Hx of DKA/HHS: never Diabetes associated symptoms: severe retinopathy, legally blind Blood glucose readings: 140's- 170's fasting AM Hypoglycemia frequency: none Severe hypoglycemia (requiring 3rd party assistance): once, April 2019  Reports she is having nausea and vomiting, several days per week, usually 1 episode per day. Emesis will contain stomach contents of biles. Generally occurs within hours of taking Xigduo  Depression/Anxiety: taking Sertaline without difficulty. Denies depressed mood or anhedonia. She has been under increased stress lately with husband's health issues. Has good family support system. Denies symptoms of mania/hypomania. Denies suicidal thinking. Denies auditory/visual hallucinations.  Depression screen [facility] 2/9 06/17/2018 12/03/2017 09/03/2017 03/28/2017 02/27/2017  Decreased Interest 0 1 2 0 2  Down, Depressed, Hopeless 0 0 1 0 2  PHQ - 2 Score 0 1 3 0 4  Altered sleeping 3 2 3 1 3   Tired, decreased energy 2 2 3 1 3   Change in appetite 1 1 1 1 2   Feeling bad or failure about yourself  0 0 0 0 1  Trouble concentrating 0 0 0 0 1  Moving slowly or fidgety/restless 0 0 0 0 0  Suicidal thoughts 0 0 0 0 1  PHQ-9 Score 6 6 10 3 15   Difficult doing work/chores Not difficult at all - - - -    GAD 7 : Generalized Anxiety Score 12/03/2017 09/03/2017 03/28/2017  Nervous, Anxious, on  Edge 0 1 0  Control/stop worrying 0 1 1  Worry too much - different things 1 2 1   Trouble relaxing 0 1 0  Restless 0 1 0  Easily annoyed or irritable 0 1 0  Afraid - awful might happen 0 0 0  Total GAD 7 Score 1 7 2       Past Medical History:  Diagnosis Date  . Basal cell carcinoma (BCC) of face   . Chronic kidney disease   . Depression   . Diabetes mellitus without complication ([place])   . Diabetic nephropathy ([place])   . Diabetic retinopathy ([place])   . Eczema 02/28/2017  . Legally blind 02/27/2017  . Legally blind   . Osteopenia determined by x-ray 03/14/2017   Repeat DEXA 02/2018  . Postmenopausal 02/28/2017  . Scalp psoriasis 02/28/2017  . Severe non-proliferative diabetic retinopathy ([place])    Past Surgical History:  Procedure Laterality Date  . CATARACT EXTRACTION    . COLONOSCOPY  2015  . EYE SURGERY    . TUBAL LIGATION     Social History   Tobacco Use  . Smoking status: Never Smoker  . Smokeless tobacco: Never Used  Substance Use Topics  . Alcohol use: Yes    Comment: <1 drink/month   family  history includes Alcohol abuse in her father; Breast cancer in her mother; Cancer in her mother; Diabetes in her father; Heart attack in her paternal grandfather; Hypertension in her mother; Skin cancer in her mother; Stroke in her father.    ROS: negative except as noted in the HPI  Medications: Current Outpatient Medications  Medication Sig Dispense Refill  . AMBULATORY NON FORMULARY MEDICATION Single glucometer with lancets, test strips.  Check morning fasting blood sugar daily Diagnosis code: E11.3493, E11.21 1 each 0  . atorvastatin (LIPITOR) 10 MG tablet Take 0.5 tablets (5 mg total) by mouth at bedtime. 45 tablet 1  . betamethasone dipropionate 0.05 % lotion Apply topically 2 (two) times daily. 60 mL 0  . Calcium Carbonate-Vitamin D 600-400 MG-UNIT tablet Take 1 tablet by mouth 2 (two) times daily. 60 tablet 11  . dapagliflozin propanediol (FARXIGA) 10 MG TABS tablet  Take 10 mg by mouth daily. 90 tablet 1  . losartan (COZAAR) 25 MG tablet Take 0.5 tablets (12.5 mg total) by mouth daily. 45 tablet 1  . metFORMIN (GLUCOPHAGE XR) 500 MG 24 hr tablet Take 1 tablet (500 mg total) by mouth 2 (two) times daily with a meal. 180 tablet 1  . Multiple Vitamins-Minerals (MULTIVITAMIN ADULT) CHEW Chew by mouth.    . mupirocin ointment (BACTROBAN) 2 % Apply 1 application topically 3 (three) times daily. 22 g 0  . nitroGLYCERIN (NITRODUR - DOSED IN MG/24 HR) 0.2 mg/hr patch 1/4 patch to heel for tendonitis 30 patch 1  . ondansetron (ZOFRAN-ODT) 4 MG disintegrating tablet Take 1 tablet (4 mg total) by mouth every 6 (six) hours as needed for nausea or vomiting. 16 tablet 0  . pimecrolimus (ELIDEL) 1 % cream Apply topically 2 (two) times daily as needed. 30 g 2  . sertraline (ZOLOFT) 100 MG tablet Take 1 tablet (100 mg total) by mouth at bedtime. 90 tablet 1  . VENTOLIN HFA 108 (90 Base) MCG/ACT inhaler INHALE 1-2 PUFFS INTO THE LUNGS EVERY 4 (FOUR) HOURS AS NEEDED FOR WHEEZING OR SHORTNESS OF BREATH 1 Inhaler 5   No current facility-administered medications for this visit.    Allergies  Allergen Reactions  . Bydureon [Exenatide] Swelling    Lip swelling  . Lisinopril Cough       Objective:  BP 107/70   Pulse 81   Wt 127 lb (57.6 kg)   BMI 23.61 kg/m  Gen:  alert, not ill-appearing, no distress, appropriate for age 76: head normocephalic without obvious abnormality, conjunctiva and cornea clear, trachea midline Pulm: Normal work of breathing, normal phonation, clear to auscultation bilaterally, no wheezes, rales or rhonchi CV: Normal rate, regular rhythm, s1 and s2 distinct, no murmurs, clicks or rubs  Neuro: alert and oriented x 3, no tremor MSK: extremities atraumatic, normal gait and station Skin: intact, no rashes on exposed skin, no jaundice, no cyanosis Psych: well-groomed, cooperative, good eye contact, euthymic mood, affect mood-congruent, speech is  articulate, and thought processes clear and goal-directed    Results for orders placed or performed in visit on 06/17/18 (from the past 72 hour(s))  POCT HgB A1C     Status: None   Collection Time: 06/17/18  1:29 PM  Result Value Ref Range   Hemoglobin A1C     HbA1c POC (<> result, manual entry)     HbA1c, POC (prediabetic range) 6.2 5.7 - 6.4 %   HbA1c, POC (controlled diabetic range)    Renal Function Panel     Status: Abnormal  Collection Time: 06/17/18  1:48 PM  Result Value Ref Range   Glucose, Bld 96 65 - 99 mg/dL    Comment: .            Fasting reference interval .    BUN 27 (H) 7 - 25 mg/dL   Creat 1.60 (H) 0.50 - 0.99 mg/dL    Comment: For patients >70 years of age, the reference limit for Creatinine is approximately 13% higher for people identified as African-American. .    BUN/Creatinine Ratio 17 6 - 22 (calc)   Sodium 140 135 - 146 mmol/L   Potassium 4.0 3.5 - 5.3 mmol/L   Chloride 102 98 - 110 mmol/L   CO2 30 20 - 32 mmol/L   Calcium 9.4 8.6 - 10.4 mg/dL   Phosphorus 3.8 2.1 - 4.3 mg/dL   Albumin 4.4 3.6 - 5.1 g/dL  Urine Microalbumin w/creat. ratio     Status: Abnormal   Collection Time: 06/17/18  1:48 PM  Result Value Ref Range   Creatinine, Urine 152 20 - 275 mg/dL   Microalb, Ur 7.8 mg/dL    Comment: Reference Range Not established    Microalb Creat Ratio 51 (H) <30 mcg/mg creat    Comment: . The ADA defines abnormalities in albumin excretion as follows: Marland Kitchen Category         Result (mcg/mg creatinine) . Normal                    <30 Microalbuminuria         30-299  Clinical albuminuria   > OR = 300 . The ADA recommends that at least two of three specimens collected within a 3-6 month period be abnormal before considering a patient to be within a diagnostic category.    No results found.    Assessment and Plan: 67 y.o. female with   .[place] was seen today for hyperglycemia.  Diagnoses and all orders for this visit:  Type 2  diabetes mellitus with severe nonproliferative retinopathy of both eyes, without long-term current use of insulin, macular edema presence unspecified (HCC) -     POCT HgB A1C -     dapagliflozin propanediol (FARXIGA) 10 MG TABS tablet; Take 10 mg by mouth daily. -     metFORMIN (GLUCOPHAGE XR) 500 MG 24 hr tablet; Take 1 tablet (500 mg total) by mouth 2 (two) times daily with a meal.  Scalp psoriasis -     betamethasone dipropionate 0.05 % lotion; Apply topically 2 (two) times daily.  Non-intractable vomiting with nausea, unspecified vomiting type -     ondansetron (ZOFRAN-ODT) 4 MG disintegrating tablet; Take 1 tablet (4 mg total) by mouth every 6 (six) hours as needed for nausea or vomiting.  Diabetic nephropathy associated with type 2 diabetes mellitus ([place]) -     Renal Function Panel -     Urine Microalbumin w/creat. ratio -     losartan (COZAAR) 25 MG tablet; Take 0.5 tablets (12.5 mg total) by mouth daily.  Recurrent major depressive disorder, in partial remission (HCC) -     sertraline (ZOLOFT) 100 MG tablet; Take 1 tablet (100 mg total) by mouth at bedtime.  Chronic kidney disease, unspecified CKD stage -     Renal Function Panel -     Urine Microalbumin w/creat. ratio  Encounter for immunization -     Flu vaccine HIGH DOSE PF   DM2 Well controlled, A1c 6.2 today We will switch from [place] to [place] and  Metformin XR (difficulty swallowing large pills). Reducing dose of Metformin from 2000 to 1000. She has lost 20 pounds and has recent history of severe hypoglycemia, so I will hold off on adding a third agent such as DPP4 at this time She will monitor morning FBG at home and contact me for glucose consistently above 130 High dose influenza vaccine given today Pneumovax and Tdap UTD Requesting Ophth. Records from Dr. Sherlynn Stalls LDL goal <70, cont Atorvastatin Cont ARB for nephropathy  Patient education and anticipatory guidance given Patient agrees with treatment  plan Follow-up in 3 months or sooner as needed if symptoms worsen or fail to improve  Darlyne Russian PA-C

## 2018-06-17 NOTE — Patient Instructions (Addendum)
Diabetes Preventive Care: - annual foot exam  - annual dilated eye exam with an eye doctor - self foot exams at least weekly - pneumonia vaccine once (booster in 5 years and at age 67) - annual influenza vaccine - twice yearly dental cleanings and yearly exam - goal blood pressure <140/90, ideally <130/80 - LDL cholesterol <70 - A1C <7.0 - body mass index (BMI) <25.0 - follow-up every 3 months if your A1C is not at goal - follow-up every 6 months if diabetes is well controlled  Monitor morning fasting glucose daily Goal 90-130 If above 130 constistently contact our office    Diabetic Nephropathy Diabetic nephropathy is kidney disease that is caused by diabetes (diabetes mellitus). Kidneys are organs that filter and clean blood and get rid of body waste products and extra fluid. Diabetes can cause gradual kidney damage over many years. Diabetic nephropathy that continues to get worse (progress) can lead to kidney failure. What are the causes? This condition is caused by kidney damage from diabetes that is not well controlled with treatment. Having high blood sugar (glucose) for a long time can damage blood vessels in the kidneys and cause them to thicken and scar. This prevents the kidneys from functioning normally. What increases the risk? This condition is more likely to develop in people with diabetes who:  Have had diabetes for many years.  Have high blood pressure.  Have high blood glucose levels over a long period of time.  Have a family history of kidney disease.  Have a history of tobacco use.  Have certain genes that are passed from parent to child (inherited).  Are of African-American, Hispanic, Native American, Asian, or [place] descent.  What are the signs or symptoms? This condition may not cause symptoms at first. If you do have symptoms, they may include:  Swelling of your hands, feet, or ankles.  Weakness.  Poor  appetite.  Nausea.  Confusion.  Fatigue.  Trouble sleeping.  Dry, itchy skin.  If nephropathy leads to kidney failure, symptoms may include:  Vomiting.  Shortness of breath.  Jerky movements you cannot control (seizure).  Coma.  How is this diagnosed? It is important to diagnose this condition before symptoms develop. You may be screened for diabetic nephropathy at a routine health care visit. Screening tests may include:  Yearly (annual) urine tests.  Urine collection over a 24-hour period to measure kidney function.  Blood tests that measure blood glucose levels and kidney function.  If your health care provider suspects diabetic nephropathy, he or she may:  Review your medical history and symptoms.  Do a physical exam.  Do an ultrasound of your kidneys.  Perform a procedure to take a sample of kidney tissue for testing (biopsy).  How is this treated? The goal of treatment is to prevent or slow down damage to your kidneys by managing your diabetes. To do this, it is important to control:  Your blood pressure. ? Generally, the goal is to keep your blood pressure below 130/80. Your target blood pressure depends on many factors. ? To help control blood pressure, you may be prescribed medicines to lower blood pressure (ACE inhibitors) or to help your body get rid of excess fluid (diuretics).  Your A1c (hemoglobin A1c) level. Generally, the goal of treatment is to maintain an A1c level of less than 7%.  Your blood glucose level.  Your blood lipids. If you have high cholesterol, you may need to take lipid-lowering drugs, such as statins.  Other treatments may include:  Medicines, including insulin injections.  Lifestyle changes, such as: ? Losing weight. ? Quitting smoking (smoking cessation).  Changes to your diet, which may include: ? Limiting your salt (sodium), protein, and fluid intake. ? Taking vitamin D supplements.  If your disease progresses to  end-stage kidney failure, treatment may include:  Dialysis. This is a procedure to filter your blood with a machine.  Kidney transplant.  Follow these instructions at home: Lifestyle  Maintain a healthy weight. Work with your health care provider to lose weight, if needed.  Do not use any products that contain nicotine or tobacco, such as cigarettes and e-cigarettes. If you need help quitting, ask your health care provider.  Be physically active every day. Ask your health care provider what type of exercise is best for you.  Eat healthy foods, and eat healthy snacks between meals. Follow instructions from your health care provider about eating and drinking restrictions. ? Limit your sodium, protein, or fluid intake as directed.  Work with your health care provider to manage your blood pressure. General instructions  Follow your diabetes management plan as directed. ? Check your blood glucose levels as directed by your health care provider. ? Keep your blood glucose in your target range as directed by your health care provider. ? Have your A1c level checked at least two times a year, or as often as told by your health care provider.  Take over-the-counter and prescription medicines only as told by your health care provider. This includes insulin and supplements.  Keep all follow-up visits and routine visits as told by your health care provider. This is important. Make sure to get screening tests as directed. Contact a health care provider if:  You have trouble keeping your blood glucose in your goal range.  Your blood glucose level is higher than 240 mg/dL (13.3 mmol/L) for 2 days in a row.  You have swelling in your hands, ankles, or feet.  You feel weak, tired, or dizzy.  You have involuntary muscle tightening (spasms).  You have nausea or vomiting.  You feel tired all the time. Get help right away if:  You are very sleepy.  You have: ? A seizure. ? Severe, painful  muscle spasms. ? Shortness of breath. ? Chest pain.  You faint. Summary  Keep your blood glucose in your target range as directed by your health care provider.  Work with your health care provider to manage your blood pressure.  Keep all follow-up visits and routine visits as told by your health care provider. This is important. Make sure to get screening tests as directed. This information is not intended to replace advice given to you by your health care provider. Make sure you discuss any questions you have with your health care provider. Document Released: 08/26/2007 Document Revised: 07/04/2016 Document Reviewed: 07/04/2016 Elsevier Interactive Patient Education  [employer].

## 2018-06-18 ENCOUNTER — Encounter: Payer: Self-pay | Admitting: Physician Assistant

## 2018-06-18 LAB — MICROALBUMIN / CREATININE URINE RATIO
CREATININE, URINE: 152 mg/dL (ref 20–275)
Microalb Creat Ratio: 51 mcg/mg creat — ABNORMAL HIGH (ref <30)
Microalb, Ur: 7.8 mg/dL

## 2018-06-18 LAB — RENAL FUNCTION PANEL
ALBUMIN MSPROF: 4.4 g/dL (ref 3.6–5.1)
BUN / CREAT RATIO: 17 (calc) (ref 6–22)
BUN: 27 mg/dL — AB (ref 7–25)
CHLORIDE: 102 mmol/L (ref 98–110)
CO2: 30 mmol/L (ref 20–32)
CREATININE: 1.6 mg/dL — AB (ref 0.50–0.99)
Calcium: 9.4 mg/dL (ref 8.6–10.4)
GLUCOSE: 96 mg/dL (ref 65–99)
POTASSIUM: 4 mmol/L (ref 3.5–5.3)
Phosphorus: 3.8 mg/dL (ref 2.1–4.3)
Sodium: 140 mmol/L (ref 135–146)

## 2018-06-18 NOTE — Addendum Note (Signed)
Addended by: Nelson Chimes E on: 06/18/2018 01:35 PM   Modules accepted: Orders

## 2018-06-18 NOTE — Progress Notes (Signed)
Kidney function is a little worse compared to 3 months ago I suspect this is due to dehydration Would like her to push PO fluids - at least 1.5 liters of water per day Recheck kidney function on Friday

## 2018-06-20 ENCOUNTER — Other Ambulatory Visit: Payer: Self-pay

## 2018-06-20 DIAGNOSIS — R7989 Other specified abnormal findings of blood chemistry: Secondary | ICD-10-CM

## 2018-06-20 DIAGNOSIS — E1121 Type 2 diabetes mellitus with diabetic nephropathy: Secondary | ICD-10-CM

## 2018-06-20 DIAGNOSIS — E113493 Type 2 diabetes mellitus with severe nonproliferative diabetic retinopathy without macular edema, bilateral: Secondary | ICD-10-CM

## 2018-06-20 LAB — BASIC METABOLIC PANEL WITH GFR
BUN / CREAT RATIO: 14 (calc) (ref 6–22)
BUN: 21 mg/dL (ref 7–25)
CHLORIDE: 106 mmol/L (ref 98–110)
CO2: 27 mmol/L (ref 20–32)
Calcium: 9.3 mg/dL (ref 8.6–10.4)
Creat: 1.53 mg/dL — ABNORMAL HIGH (ref 0.50–0.99)
GFR, Est African American: 41 mL/min/{1.73_m2} — ABNORMAL LOW (ref >=60)
GFR, Est Non African American: 35 mL/min/{1.73_m2} — ABNORMAL LOW (ref >=60)
Glucose, Bld: 96 mg/dL (ref 65–99)
Potassium: 4.8 mmol/L (ref 3.5–5.3)
Sodium: 142 mmol/L (ref 135–146)

## 2018-06-23 MED ORDER — SITAGLIPTIN PHOSPHATE 25 MG PO TABS: 25.0000 mg | ORAL_TABLET | Freq: Every day | ORAL | 0 refills | Status: DC

## 2018-07-01 DIAGNOSIS — E113513 Type 2 diabetes mellitus with proliferative diabetic retinopathy with macular edema, bilateral: Secondary | ICD-10-CM | POA: Diagnosis not present

## 2018-07-29 DIAGNOSIS — H43811 Vitreous degeneration, right eye: Secondary | ICD-10-CM | POA: Diagnosis not present

## 2018-07-29 DIAGNOSIS — H211X3 Other vascular disorders of iris and ciliary body, bilateral: Secondary | ICD-10-CM | POA: Diagnosis not present

## 2018-07-29 DIAGNOSIS — E113513 Type 2 diabetes mellitus with proliferative diabetic retinopathy with macular edema, bilateral: Secondary | ICD-10-CM | POA: Diagnosis not present

## 2018-07-29 DIAGNOSIS — H35373 Puckering of macula, bilateral: Secondary | ICD-10-CM | POA: Diagnosis not present

## 2018-08-29 DIAGNOSIS — H35373 Puckering of macula, bilateral: Secondary | ICD-10-CM | POA: Diagnosis not present

## 2018-08-29 DIAGNOSIS — E113513 Type 2 diabetes mellitus with proliferative diabetic retinopathy with macular edema, bilateral: Secondary | ICD-10-CM | POA: Diagnosis not present

## 2018-08-29 DIAGNOSIS — H43811 Vitreous degeneration, right eye: Secondary | ICD-10-CM | POA: Diagnosis not present

## 2018-08-29 DIAGNOSIS — H211X3 Other vascular disorders of iris and ciliary body, bilateral: Secondary | ICD-10-CM | POA: Diagnosis not present

## 2018-09-16 DIAGNOSIS — E113513 Type 2 diabetes mellitus with proliferative diabetic retinopathy with macular edema, bilateral: Secondary | ICD-10-CM | POA: Diagnosis not present

## 2018-09-17 ENCOUNTER — Ambulatory Visit (INDEPENDENT_AMBULATORY_CARE_PROVIDER_SITE_OTHER): Payer: Medicare Other | Admitting: Physician Assistant

## 2018-09-17 ENCOUNTER — Encounter: Payer: Self-pay | Admitting: Physician Assistant

## 2018-09-17 ENCOUNTER — Telehealth: Payer: Self-pay | Admitting: Physician Assistant

## 2018-09-17 VITALS — BP 104/69 | HR 80 | Wt 140.0 lb

## 2018-09-17 DIAGNOSIS — E1169 Type 2 diabetes mellitus with other specified complication: Secondary | ICD-10-CM

## 2018-09-17 DIAGNOSIS — B351 Tinea unguium: Secondary | ICD-10-CM | POA: Diagnosis not present

## 2018-09-17 DIAGNOSIS — E113493 Type 2 diabetes mellitus with severe nonproliferative diabetic retinopathy without macular edema, bilateral: Secondary | ICD-10-CM

## 2018-09-17 DIAGNOSIS — Z23 Encounter for immunization: Secondary | ICD-10-CM | POA: Diagnosis not present

## 2018-09-17 DIAGNOSIS — E119 Type 2 diabetes mellitus without complications: Secondary | ICD-10-CM

## 2018-09-17 DIAGNOSIS — E1121 Type 2 diabetes mellitus with diabetic nephropathy: Secondary | ICD-10-CM

## 2018-09-17 DIAGNOSIS — E785 Hyperlipidemia, unspecified: Secondary | ICD-10-CM

## 2018-09-17 DIAGNOSIS — D649 Anemia, unspecified: Secondary | ICD-10-CM | POA: Diagnosis not present

## 2018-09-17 LAB — POCT GLYCOSYLATED HEMOGLOBIN (HGB A1C): HbA1c, POC (prediabetic range): 6.3 % (ref 5.7–6.4)

## 2018-09-17 MED ORDER — METFORMIN HCL ER 500 MG PO TB24: 500.0000 mg | ORAL_TABLET | Freq: Two times a day (BID) | ORAL | 1 refills | Status: AC

## 2018-09-17 MED ORDER — SITAGLIPTIN PHOSPHATE 25 MG PO TABS: 25.0000 mg | ORAL_TABLET | Freq: Every day | ORAL | 1 refills | Status: DC

## 2018-09-17 NOTE — Patient Instructions (Signed)
Diabetes Preventive Care: - annual foot exam  - annual dilated eye exam with an eye doctor - self foot exams at least weekly - pneumonia vaccine once (booster in 5 years and at age 68) - annual influenza vaccine - twice yearly dental cleanings and yearly exam - goal blood pressure <140/90, ideally <130/80 - LDL cholesterol <70 - A1C <7.0 - body mass index (BMI) <25.0 - follow-up every 3 months if your A1C is not at goal - follow-up every 6 months if diabetes is well controlled

## 2018-09-17 NOTE — Progress Notes (Signed)
HPI:                                                                Nichole Fitzgerald is a 68 y.o. female who presents to [place]: [place] today for medication management  DMII:  she was switched to Januvia and Metformin. Compliant with medications. She had some hypoglycemia  Months ago and her Glimeperide was discontinued. Has not had any episodes since Denies polydipsia, polyuria, polyphagia. She has severe retinopathy and is legally blind; denies vision change. Denies extremity pain, altered sensation and paresthesias.  Denies ulcers/wounds on feet. Hx of DKA/HHS: never Diabetes associated symptoms: severe retinopathy, legally blind Blood glucose readings: 120's-140's fasting AM Hypoglycemia frequency: none since last office visit Severe hypoglycemia (requiring 3rd party assistance): once, April 2019    Past Medical History:  Diagnosis Date  . Basal cell carcinoma (BCC) of face   . Chronic kidney disease   . Depression   . Diabetes mellitus without complication ([place])   . Diabetic nephropathy ([place])   . Diabetic retinopathy ([place])   . Eczema 02/28/2017  . Legally blind 02/27/2017  . Legally blind   . Osteopenia determined by x-ray 03/14/2017   Repeat DEXA 02/2018  . Postmenopausal 02/28/2017  . Scalp psoriasis 02/28/2017  . Severe non-proliferative diabetic retinopathy ([place])    Past Surgical History:  Procedure Laterality Date  . CATARACT EXTRACTION    . COLONOSCOPY  2015  . EYE SURGERY    . TUBAL LIGATION     Social History   Tobacco Use  . Smoking status: Never Smoker  . Smokeless tobacco: Never Used  Substance Use Topics  . Alcohol use: Yes    Comment: <1 drink/month   family history includes Alcohol abuse in her father; Breast cancer in her mother; Cancer in her mother; Diabetes in her father; Heart attack in her paternal grandfather; Hypertension in her mother; Skin cancer in her mother; Stroke in her father.    ROS:  negative except as noted in the HPI  Medications: Current Outpatient Medications  Medication Sig Dispense Refill  . AMBULATORY NON FORMULARY MEDICATION Single glucometer with lancets, test strips.  Check morning fasting blood sugar daily Diagnosis code: E11.3493, E11.21 1 each 0  . atorvastatin (LIPITOR) 10 MG tablet Take 0.5 tablets (5 mg total) by mouth at bedtime. 45 tablet 1  . betamethasone dipropionate 0.05 % lotion Apply topically 2 (two) times daily. 60 mL 0  . Calcium Carbonate-Vitamin D 600-400 MG-UNIT tablet Take 1 tablet by mouth 2 (two) times daily. 60 tablet 11  . losartan (COZAAR) 25 MG tablet Take 0.5 tablets (12.5 mg total) by mouth daily. 45 tablet 1  . metFORMIN (GLUCOPHAGE XR) 500 MG 24 hr tablet Take 1 tablet (500 mg total) by mouth 2 (two) times daily with a meal. 180 tablet 1  . Multiple Vitamins-Minerals (MULTIVITAMIN ADULT) CHEW Chew by mouth.    . mupirocin ointment (BACTROBAN) 2 % Apply 1 application topically 3 (three) times daily. 22 g 0  . nitroGLYCERIN (NITRODUR - DOSED IN MG/24 HR) 0.2 mg/hr patch 1/4 patch to heel for tendonitis 30 patch 1  . ondansetron (ZOFRAN-ODT) 4 MG disintegrating tablet Take 1 tablet (4 mg total) by mouth every 6 (six) hours as  needed for nausea or vomiting. 16 tablet 0  . pimecrolimus (ELIDEL) 1 % cream Apply topically 2 (two) times daily as needed. 30 g 2  . sertraline (ZOLOFT) 100 MG tablet Take 1 tablet (100 mg total) by mouth at bedtime. 90 tablet 1  . sitaGLIPtin (JANUVIA) 25 MG tablet Take 1 tablet (25 mg total) by mouth daily. 90 tablet 1  . VENTOLIN HFA 108 (90 Base) MCG/ACT inhaler INHALE 1-2 PUFFS INTO THE LUNGS EVERY 4 (FOUR) HOURS AS NEEDED FOR WHEEZING OR SHORTNESS OF BREATH 1 Inhaler 5   No current facility-administered medications for this visit.    Allergies  Allergen Reactions  . Bydureon [Exenatide] Swelling    Lip swelling  . Lisinopril Cough       Objective:  BP 104/69   Pulse 80   Wt 140 lb (63.5 kg)    BMI 26.02 kg/m  Gen:  alert, not ill-appearing, no distress, appropriate for age 41: head normocephalic without obvious abnormality, conjunctiva and cornea clear, trachea midline Pulm: Normal work of breathing, normal phonation, clear to auscultation bilaterally, no wheezes, rales or rhonchi CV: Normal rate, regular rhythm, s1 and s2 distinct, no murmurs, clicks or rubs  Neuro: alert and oriented x 3, no tremor MSK: extremities atraumatic, normal gait and station, no peripheral edema Skin: intact, no rashes on exposed skin, no jaundice, no cyanosis Psych: well-groomed, cooperative, good eye contact, euthymic mood, affect mood-congruent, speech is articulate, and thought processes clear and goal-directed  Diabetic Foot Exam - Simple   Simple Foot Form Diabetic Foot exam was performed with the following findings:  Yes 09/17/2018  1:27 PM  Visual Inspection No deformities, no ulcerations, no other skin breakdown bilaterally:  Yes See comments:  Yes Sensation Testing Intact to touch and monofilament testing bilaterally:  Yes Pulse Check Posterior Tibialis and Dorsalis pulse intact bilaterally:  Yes Comments Bilateral onychomycosis     Lab Results  Component Value Date   CHOL 117 03/26/2017   HDL 54 03/26/2017   TRIG 128 03/26/2017   CHOLHDL 2.2 03/26/2017   Lab Results  Component Value Date   WBC 10.7 12/03/2017   HGB 11.2 (L) 12/03/2017   HCT 33.9 (L) 12/03/2017   MCV 84.5 12/03/2017   PLT 251 12/03/2017    Lab Results  Component Value Date   IRON 66 12/10/2017   TIBC 294 12/10/2017   FERRITIN 69 12/10/2017     Results for orders placed or performed in visit on 09/17/18 (from the past 72 hour(s))  POCT HgB A1C     Status: None   Collection Time: 09/17/18  1:14 PM  Result Value Ref Range   Hemoglobin A1C     HbA1c POC (<> result, manual entry)     HbA1c, POC (prediabetic range) 6.3 5.7 - 6.4 %   HbA1c, POC (controlled diabetic range)     No results  found.    Assessment and Plan: 68 y.o. female with   .Nichole Fitzgerald was seen today for hyperglycemia.  Diagnoses and all orders for this visit:  Type 2 diabetes mellitus with severe nonproliferative retinopathy of both eyes, without long-term current use of insulin, macular edema presence unspecified (HCC) -     POCT HgB A1C -     sitaGLIPtin (JANUVIA) 25 MG tablet; Take 1 tablet (25 mg total) by mouth daily. -     metFORMIN (GLUCOPHAGE XR) 500 MG 24 hr tablet; Take 1 tablet (500 mg total) by mouth 2 (two) times daily with a meal. -  Renal Profile with Estimated GFR  Diabetic nephropathy associated with type 2 diabetes mellitus ([place]) -     Renal Profile with Estimated GFR  Normocytic anemia -     CBC with Differential/Platelet -     Anemia Profile B  Dyslipidemia, goal LDL below 70 -     Lipid Profile  Encounter for diabetic foot exam ([place])   DM2 Well controlled, A1c 6.3 today Cont Metformin XR 500 mg bid and Januvia 25 mg QD Foot exam performed today and wnl Pneumovax given today Prevnar and Tdap UTD Eye exam UTD per patient; Requesting Ophth. Records from Dr. Sherlynn Stalls LDL goal <70, lipids pending, cont Atorvastatin Cont ARB for nephropathy  CKD Repeat renal function today Avoid nephrotoxins Avoid SGLT2  Monitor Q1mos  Normocytic anemia Likely anemia of chronic disease Normal iron panel 8 months ago, Colonscopy UTD Repeat CBC and anemia panel today  Patient education and anticipatory guidance given Patient agrees with treatment plan Follow-up in 3 months for Medicare Wellness or sooner as needed if symptoms worsen or fail to improve  Darlyne Russian PA-C

## 2018-09-17 NOTE — Telephone Encounter (Signed)
Thank you :)

## 2018-09-17 NOTE — Telephone Encounter (Signed)
Anemia profile B is no longer an active [facility] code. Must order labs separately. Orders placed.  457 - Ferritin  466 - Folate, Serum  793 - Reticulocyte Count, Automated  927 - Vitamin B12 (Cobalamin)  6399 - CBC (includes Differential and Platelets)  7573 - Iron, Total and Total Iron Binding Capacity

## 2018-09-18 LAB — FERRITIN: Ferritin: 42 ng/mL (ref 16–288)

## 2018-09-18 LAB — RENAL PROFILE WITH ESTIMATED GFR
Albumin: 3.9 g/dL (ref 3.6–5.1)
BUN/Creatinine Ratio: 21 (calc) (ref 6–22)
BUN: 22 mg/dL (ref 7–25)
CO2: 27 mmol/L (ref 20–32)
CREATININE: 1.05 mg/dL — AB (ref 0.50–0.99)
Calcium: 8.9 mg/dL (ref 8.6–10.4)
Chloride: 108 mmol/L (ref 98–110)
GFR, EST NON AFRICAN AMERICAN: 55 mL/min/{1.73_m2} — AB (ref >=60)
GFR, Est African American: 64 mL/min/{1.73_m2} (ref >=60)
GLUCOSE: 112 mg/dL — AB (ref 65–99)
PHOSPHORUS: 4.4 mg/dL — AB (ref 2.1–4.3)
POTASSIUM: 4.1 mmol/L (ref 3.5–5.3)
Sodium: 143 mmol/L (ref 135–146)

## 2018-09-18 LAB — LIPID PANEL
Cholesterol: 137 mg/dL (ref <200)
HDL: 57 mg/dL (ref >50)
LDL Cholesterol (Calc): 60 mg/dL (calc)
NON-HDL CHOLESTEROL (CALC): 80 mg/dL (ref <130)
TRIGLYCERIDES: 114 mg/dL (ref <150)
Total CHOL/HDL Ratio: 2.4 (calc) (ref <5.0)

## 2018-09-18 LAB — CBC WITH DIFFERENTIAL/PLATELET
ABSOLUTE MONOCYTES: 412 {cells}/uL (ref 200–950)
BASOS PCT: 0.4 %
Basophils Absolute: 20 cells/uL (ref 0–200)
Eosinophils Absolute: 132 cells/uL (ref 15–500)
Eosinophils Relative: 2.7 %
HCT: 34 % — ABNORMAL LOW (ref 35.0–45.0)
Hemoglobin: 11.1 g/dL — ABNORMAL LOW (ref 11.7–15.5)
Lymphs Abs: 1534 cells/uL (ref 850–3900)
MCH: 27.8 pg (ref 27.0–33.0)
MCHC: 32.6 g/dL (ref 32.0–36.0)
MCV: 85 fL (ref 80.0–100.0)
MONOS PCT: 8.4 %
MPV: 11.5 fL (ref 7.5–12.5)
Neutro Abs: 2803 cells/uL (ref 1500–7800)
Neutrophils Relative %: 57.2 %
PLATELETS: 202 10*3/uL (ref 140–400)
RBC: 4 10*6/uL (ref 3.80–5.10)
RDW: 12.3 % (ref 11.0–15.0)
TOTAL LYMPHOCYTE: 31.3 %
WBC: 4.9 10*3/uL (ref 3.8–10.8)

## 2018-09-18 LAB — RETICULOCYTES
ABS Retic: 28000 cells/uL (ref 20000–8000)
RETIC CT PCT: 0.7 %

## 2018-09-18 LAB — IRON, TOTAL/TOTAL IRON BINDING CAP
%SAT: 18 % (ref 16–45)
Iron: 57 ug/dL (ref 45–160)
TIBC: 312 mcg/dL (calc) (ref 250–450)

## 2018-09-18 LAB — FOLATE: Folate: >24 ng/mL

## 2018-09-18 LAB — VITAMIN B12: Vitamin B-12: 1192 pg/mL — ABNORMAL HIGH (ref 200–1100)

## 2018-09-19 LAB — HM DIABETES EYE EXAM

## 2018-09-26 ENCOUNTER — Encounter: Payer: Self-pay | Admitting: Physician Assistant

## 2018-09-26 DIAGNOSIS — H35373 Puckering of macula, bilateral: Secondary | ICD-10-CM | POA: Diagnosis not present

## 2018-09-26 DIAGNOSIS — E113513 Type 2 diabetes mellitus with proliferative diabetic retinopathy with macular edema, bilateral: Secondary | ICD-10-CM | POA: Diagnosis not present

## 2018-09-26 DIAGNOSIS — B351 Tinea unguium: Secondary | ICD-10-CM | POA: Insufficient documentation

## 2018-09-26 DIAGNOSIS — H43811 Vitreous degeneration, right eye: Secondary | ICD-10-CM | POA: Diagnosis not present

## 2018-09-26 DIAGNOSIS — E1169 Type 2 diabetes mellitus with other specified complication: Secondary | ICD-10-CM

## 2018-09-26 DIAGNOSIS — H211X3 Other vascular disorders of iris and ciliary body, bilateral: Secondary | ICD-10-CM | POA: Diagnosis not present

## 2018-10-02 DIAGNOSIS — S92424A Nondisplaced fracture of distal phalanx of right great toe, initial encounter for closed fracture: Secondary | ICD-10-CM | POA: Diagnosis not present

## 2018-10-02 DIAGNOSIS — M79674 Pain in right toe(s): Secondary | ICD-10-CM | POA: Diagnosis not present

## 2018-10-02 DIAGNOSIS — S99921A Unspecified injury of right foot, initial encounter: Secondary | ICD-10-CM | POA: Diagnosis not present

## 2018-10-02 DIAGNOSIS — K0381 Cracked tooth: Secondary | ICD-10-CM | POA: Diagnosis not present

## 2018-10-02 DIAGNOSIS — E119 Type 2 diabetes mellitus without complications: Secondary | ICD-10-CM | POA: Diagnosis not present

## 2018-10-02 DIAGNOSIS — S0012XA Contusion of left eyelid and periocular area, initial encounter: Secondary | ICD-10-CM | POA: Diagnosis not present

## 2018-10-02 DIAGNOSIS — M19071 Primary osteoarthritis, right ankle and foot: Secondary | ICD-10-CM | POA: Diagnosis not present

## 2018-11-04 ENCOUNTER — Other Ambulatory Visit: Payer: Self-pay | Admitting: Physician Assistant

## 2018-11-04 DIAGNOSIS — E1121 Type 2 diabetes mellitus with diabetic nephropathy: Secondary | ICD-10-CM

## 2018-11-04 MED ORDER — IRBESARTAN 75 MG PO TABS: 37.5000 mg | ORAL_TABLET | Freq: Every day | ORAL | 1 refills | Status: DC

## 2018-11-21 ENCOUNTER — Other Ambulatory Visit: Payer: Self-pay | Admitting: Physician Assistant

## 2018-11-21 DIAGNOSIS — E785 Hyperlipidemia, unspecified: Secondary | ICD-10-CM

## 2018-11-27 ENCOUNTER — Encounter: Payer: Self-pay | Admitting: Osteopathic Medicine

## 2018-12-17 ENCOUNTER — Ambulatory Visit: Payer: Medicare Other

## 2019-01-26 ENCOUNTER — Other Ambulatory Visit: Payer: Self-pay | Admitting: Physician Assistant

## 2019-01-26 DIAGNOSIS — F3341 Major depressive disorder, recurrent, in partial remission: Secondary | ICD-10-CM

## 2019-02-02 ENCOUNTER — Other Ambulatory Visit: Payer: Self-pay | Admitting: Physician Assistant

## 2019-02-02 DIAGNOSIS — E1121 Type 2 diabetes mellitus with diabetic nephropathy: Secondary | ICD-10-CM

## 2019-02-19 ENCOUNTER — Other Ambulatory Visit: Payer: Self-pay | Admitting: Physician Assistant

## 2019-02-19 DIAGNOSIS — F3341 Major depressive disorder, recurrent, in partial remission: Secondary | ICD-10-CM

## 2019-03-05 ENCOUNTER — Other Ambulatory Visit: Payer: Self-pay | Admitting: Physician Assistant

## 2019-03-05 DIAGNOSIS — E113493 Type 2 diabetes mellitus with severe nonproliferative diabetic retinopathy without macular edema, bilateral: Secondary | ICD-10-CM

## 2019-03-12 ENCOUNTER — Other Ambulatory Visit: Payer: Self-pay | Admitting: Physician Assistant

## 2019-03-12 DIAGNOSIS — F3341 Major depressive disorder, recurrent, in partial remission: Secondary | ICD-10-CM

## 2019-03-12 NOTE — Telephone Encounter (Signed)
Medication notes due for follow-up with PCP.  Will not fill 90-day supply, okay to send 30.

## 2019-03-23 ENCOUNTER — Other Ambulatory Visit: Payer: Self-pay

## 2019-04-08 ENCOUNTER — Other Ambulatory Visit: Payer: Self-pay | Admitting: Physician Assistant

## 2019-04-08 DIAGNOSIS — E113493 Type 2 diabetes mellitus with severe nonproliferative diabetic retinopathy without macular edema, bilateral: Secondary | ICD-10-CM

## 2019-04-08 DIAGNOSIS — F3341 Major depressive disorder, recurrent, in partial remission: Secondary | ICD-10-CM

## 2019-04-21 ENCOUNTER — Other Ambulatory Visit: Payer: Self-pay | Admitting: Physician Assistant

## 2019-04-21 DIAGNOSIS — E1121 Type 2 diabetes mellitus with diabetic nephropathy: Secondary | ICD-10-CM

## 2019-04-23 ENCOUNTER — Other Ambulatory Visit: Payer: Self-pay | Admitting: Physician Assistant

## 2019-04-23 DIAGNOSIS — F3341 Major depressive disorder, recurrent, in partial remission: Secondary | ICD-10-CM

## 2019-04-23 DIAGNOSIS — E113493 Type 2 diabetes mellitus with severe nonproliferative diabetic retinopathy without macular edema, bilateral: Secondary | ICD-10-CM

## 2019-05-07 IMAGING — MG DIGITAL SCREENING BILATERAL MAMMOGRAM WITH CAD
7 series · 7 of 7 positions shown · non-contrast
Comparison: Previous exam(s).

CLINICAL DATA: Screening.

EXAM:
DIGITAL SCREENING BILATERAL MAMMOGRAM WITH CAD

[L CC (1 of 2)]
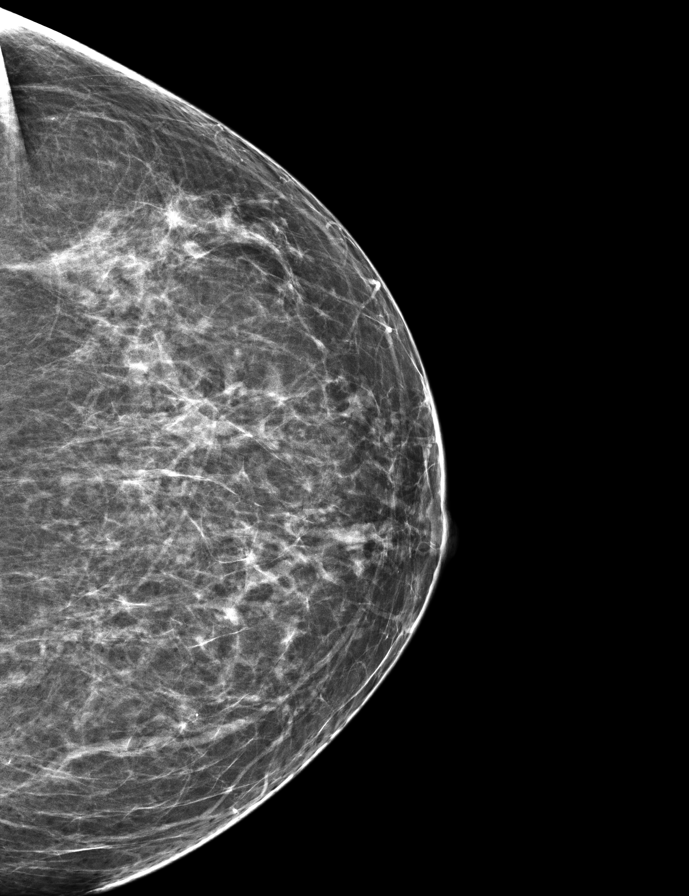

[L CC (2 of 2)]
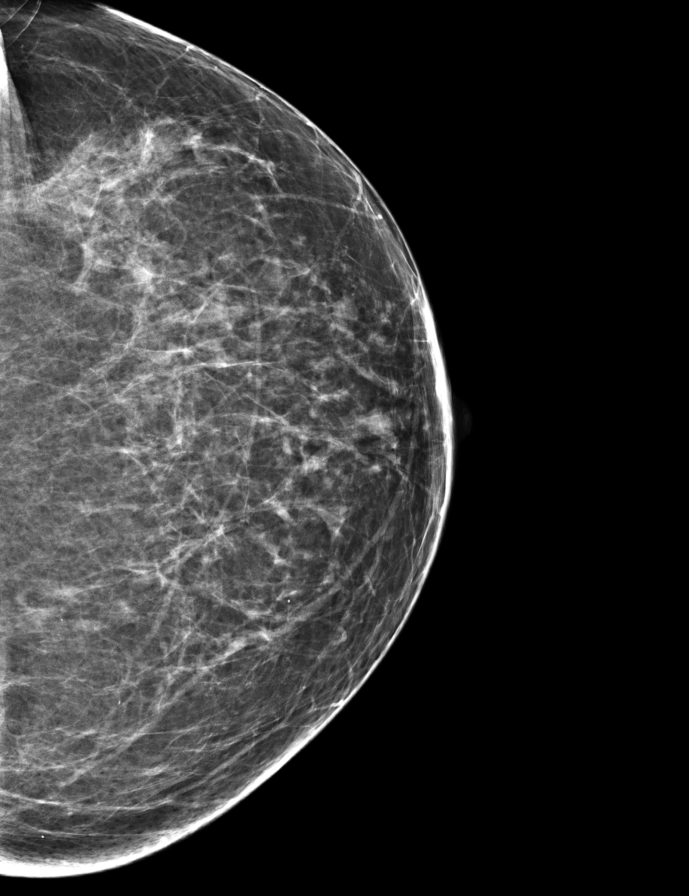

[R MLO (1 of 2)]
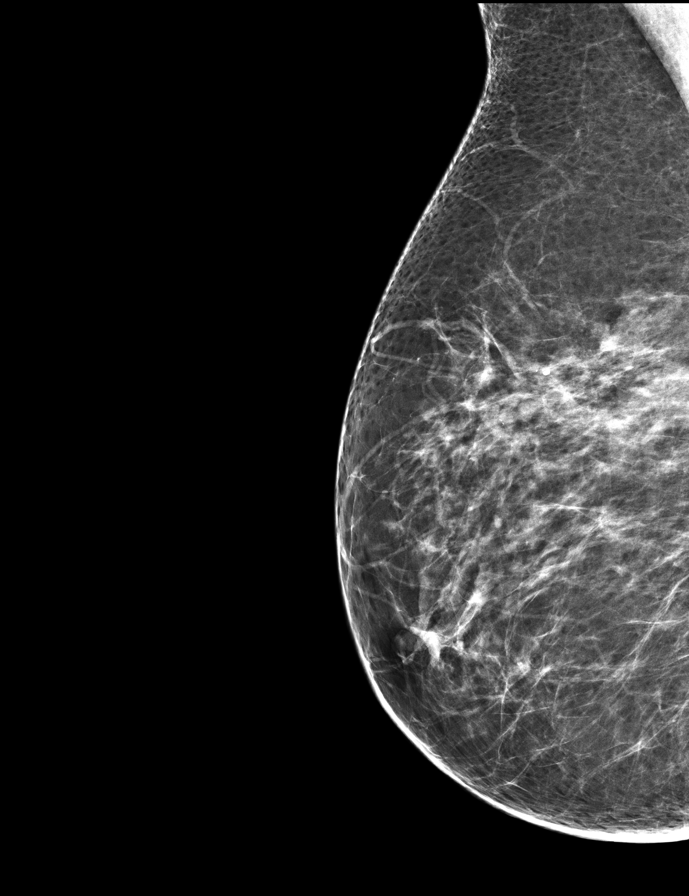

[L MLO (1 of 2)]
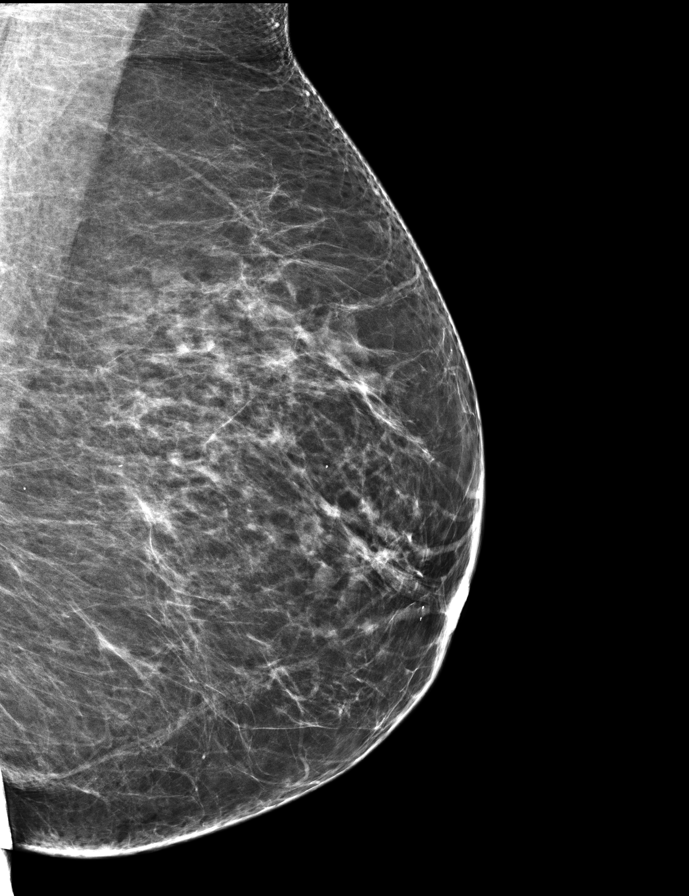

[R CC]
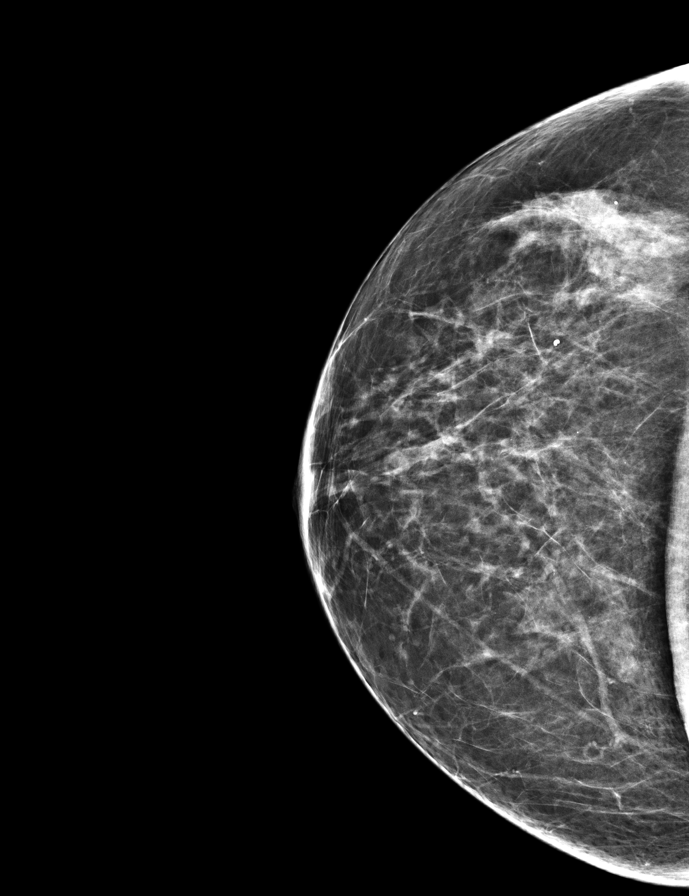

[R MLO (2 of 2)]
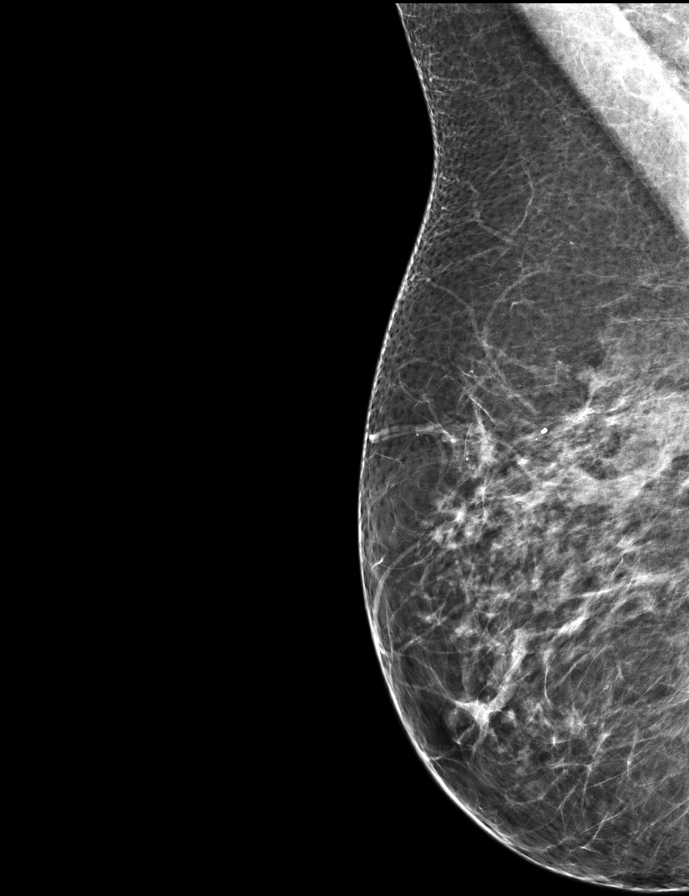

[L MLO (2 of 2)]
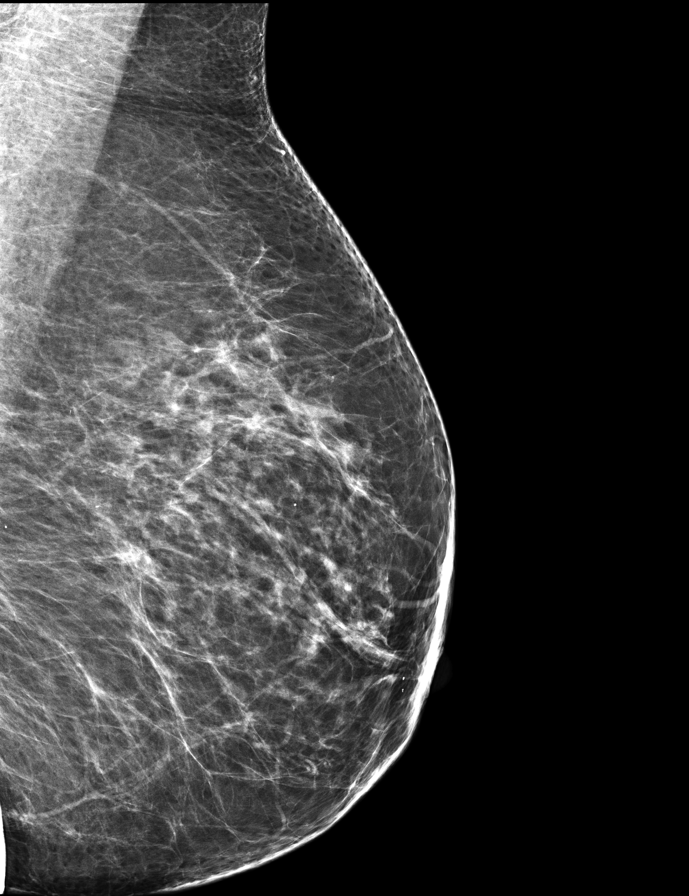

[7 of 7 positions shown; findings below may reference images not displayed]

ACR Breast Density Category b: There are scattered areas of
fibroglandular density.
FINDINGS: There are no findings suspicious for malignancy. Images were
processed with CAD.
IMPRESSION: No mammographic evidence of malignancy. A result letter of this
screening mammogram will be mailed directly to the patient.

RECOMMENDATION:
Screening mammogram in one year. (Code:AS-G-LCT)

BI-RADS CATEGORY  1: Negative.

## 2019-05-15 ENCOUNTER — Other Ambulatory Visit: Payer: Self-pay | Admitting: Physician Assistant

## 2019-05-15 DIAGNOSIS — F3341 Major depressive disorder, recurrent, in partial remission: Secondary | ICD-10-CM

## 2019-05-15 DIAGNOSIS — E113493 Type 2 diabetes mellitus with severe nonproliferative diabetic retinopathy without macular edema, bilateral: Secondary | ICD-10-CM

## 2019-05-15 MED ORDER — SITAGLIPTIN PHOSPHATE 25 MG PO TABS: 25.0000 mg | ORAL_TABLET | Freq: Every day | ORAL | 0 refills | Status: AC

## 2019-05-15 MED ORDER — SERTRALINE HCL 100 MG PO TABS: 100.0000 mg | ORAL_TABLET | Freq: Every day | ORAL | 0 refills | Status: AC

## 2019-05-15 NOTE — Telephone Encounter (Signed)
OK meds sent.

## 2019-05-15 NOTE — Telephone Encounter (Signed)
I called patient to get her  medicare wellness exam scheduled.  She informed me that she has moved to [place], [place]. She is in the process of getting a new doctor there(request for medical records faxed but not received by this office as yet) and would like a refill on her Januvia and Sertraline. She wants rxs to be sent to  [facility] [address] in [place], [place]..  Thanks.

## 2019-05-18 DIAGNOSIS — Z23 Encounter for immunization: Secondary | ICD-10-CM | POA: Diagnosis not present

## 2019-05-18 NOTE — Telephone Encounter (Signed)
Pt aware.     Thank you

## 2019-05-18 NOTE — Telephone Encounter (Signed)
Thanks. I just called the patient, she got a text from [facility] for Setraline but not the Januvia. She will call pharmacy to follow up on the [place] and then give [place] a call if needed.

## 2019-05-23 ENCOUNTER — Other Ambulatory Visit: Payer: Self-pay | Admitting: Physician Assistant

## 2019-05-23 DIAGNOSIS — E1121 Type 2 diabetes mellitus with diabetic nephropathy: Secondary | ICD-10-CM

## 2019-05-26 ENCOUNTER — Other Ambulatory Visit: Payer: Self-pay | Admitting: Physician Assistant

## 2019-05-26 DIAGNOSIS — E785 Hyperlipidemia, unspecified: Secondary | ICD-10-CM

## 2019-06-29 DIAGNOSIS — E119 Type 2 diabetes mellitus without complications: Secondary | ICD-10-CM | POA: Diagnosis not present

## 2019-09-18 DIAGNOSIS — Z23 Encounter for immunization: Secondary | ICD-10-CM | POA: Diagnosis not present

## 2019-10-16 DIAGNOSIS — Z23 Encounter for immunization: Secondary | ICD-10-CM | POA: Diagnosis not present

## 2020-01-28 IMAGING — DX DG CHEST 2V
2 series · 2 of 2 positions shown · non-contrast
Comparison: None.

CLINICAL DATA: Cough and shortness of breath

EXAM:
CHEST - 2 VIEW

[chest pa]
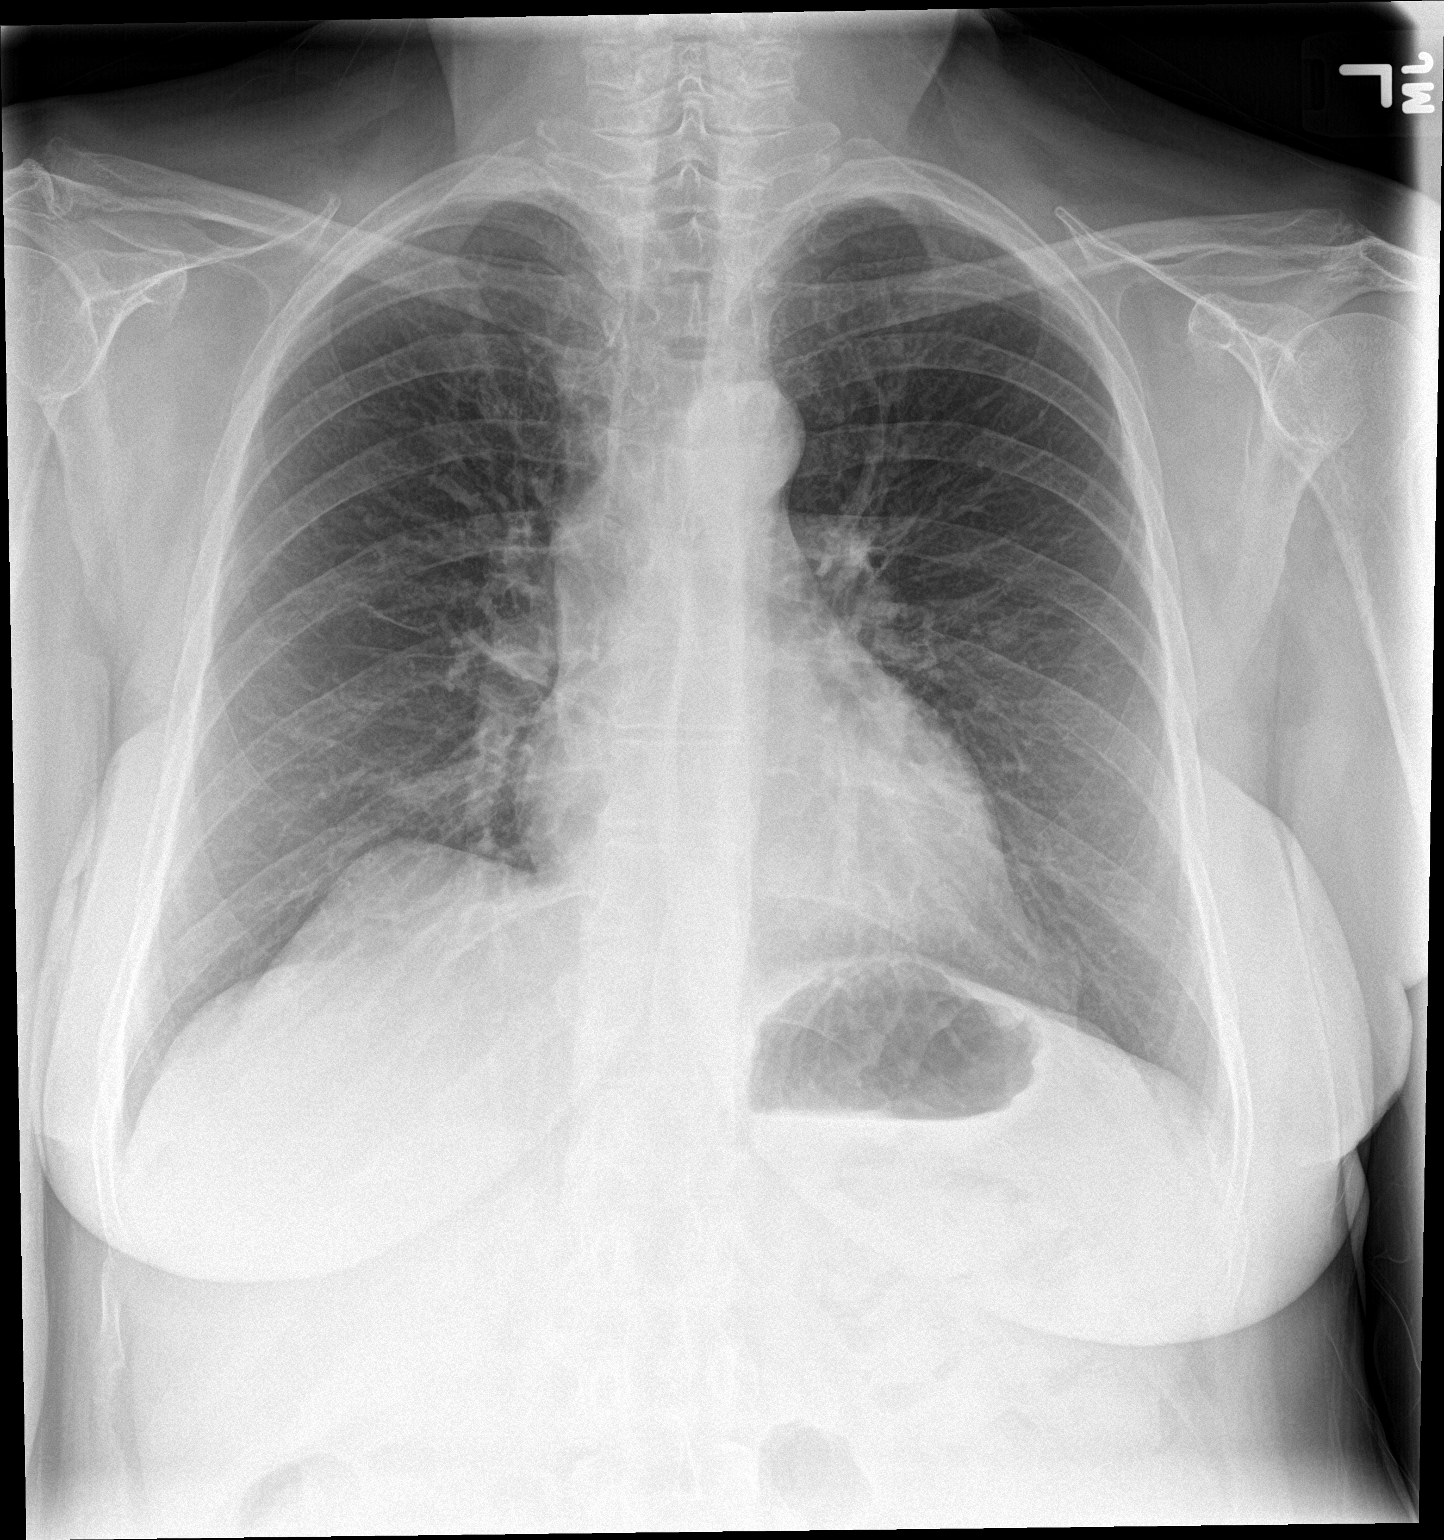

[chest lat]
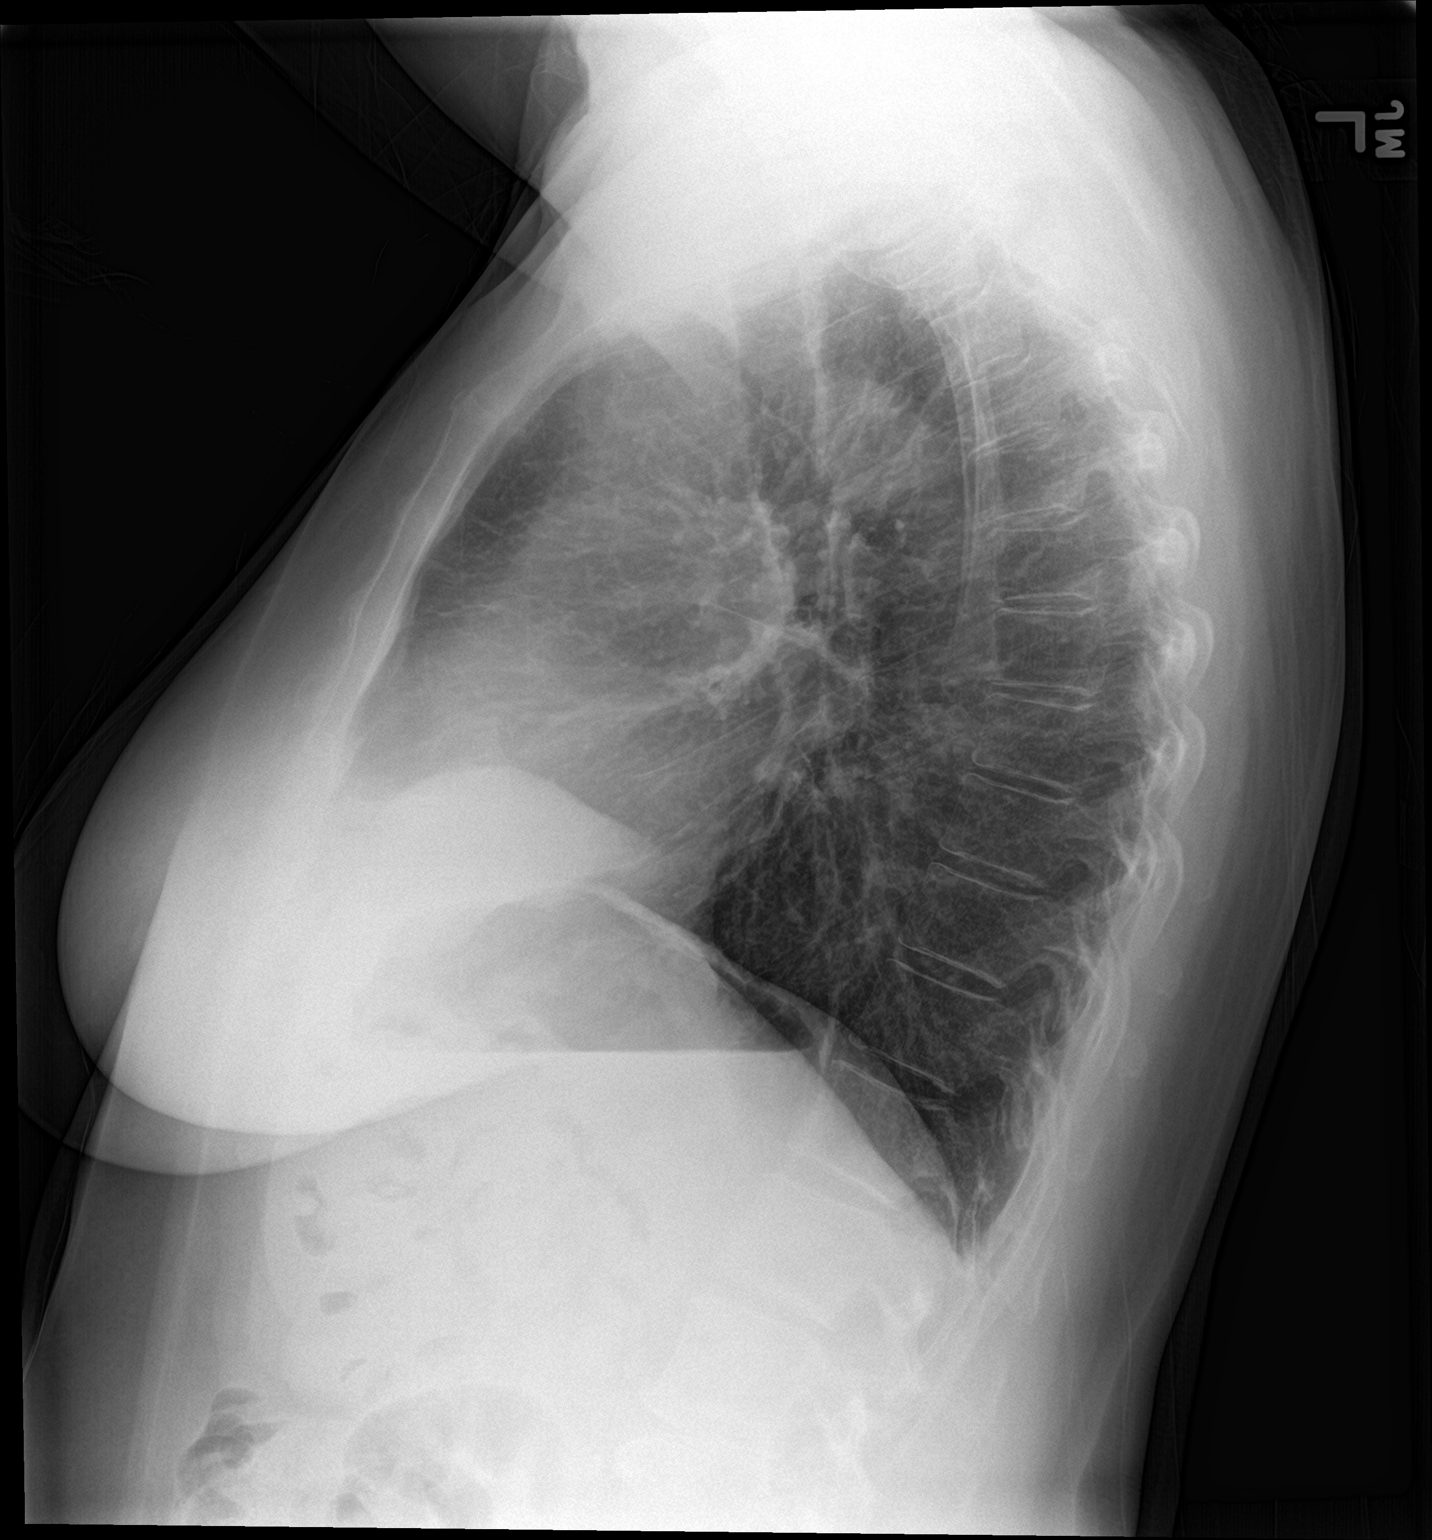

[2 of 2 positions shown; findings below may reference images not displayed]

FINDINGS: There is no edema or consolidation. Heart size and pulmonary
vascularity are normal. No adenopathy. No bone lesions.
IMPRESSION: No edema or consolidation.

## 2023-07-21 DEATH — deceased
